# Patient Record
Sex: Female | Born: 1960
Health system: Southern US, Community
[De-identification: ages and names within clinical notes are randomized; demographics above are authoritative.]

## PROBLEM LIST (undated history)

## (undated) HISTORY — PX: LAPAROSCOPY: SHX197

## (undated) HISTORY — PX: BREAST BIOPSY: SHX20

---

## 1998-02-12 ENCOUNTER — Other Ambulatory Visit: Admission: RE | Admit: 1998-02-12 | Discharge: 1998-02-12 | Payer: Self-pay | Admitting: Obstetrics and Gynecology

## 1998-11-05 ENCOUNTER — Ambulatory Visit (HOSPITAL_COMMUNITY): Admission: RE | Admit: 1998-11-05 | Discharge: 1998-11-05 | Payer: Self-pay | Admitting: *Deleted

## 1999-04-12 ENCOUNTER — Other Ambulatory Visit: Admission: RE | Admit: 1999-04-12 | Discharge: 1999-04-12 | Payer: Self-pay | Admitting: Obstetrics and Gynecology

## 1999-05-02 ENCOUNTER — Ambulatory Visit (HOSPITAL_COMMUNITY): Admission: RE | Admit: 1999-05-02 | Discharge: 1999-05-02 | Payer: Self-pay | Admitting: Obstetrics and Gynecology

## 1999-05-02 ENCOUNTER — Encounter: Payer: Self-pay | Admitting: Obstetrics and Gynecology

## 1999-10-28 ENCOUNTER — Encounter: Admission: RE | Admit: 1999-10-28 | Discharge: 1999-10-28 | Payer: Self-pay | Admitting: Obstetrics and Gynecology

## 1999-10-28 ENCOUNTER — Encounter: Payer: Self-pay | Admitting: Obstetrics and Gynecology

## 2000-03-14 ENCOUNTER — Encounter (INDEPENDENT_AMBULATORY_CARE_PROVIDER_SITE_OTHER): Payer: Self-pay | Admitting: Specialist

## 2000-03-14 ENCOUNTER — Ambulatory Visit (HOSPITAL_COMMUNITY): Admission: RE | Admit: 2000-03-14 | Discharge: 2000-03-14 | Payer: Self-pay | Admitting: Gastroenterology

## 2000-05-07 ENCOUNTER — Other Ambulatory Visit: Admission: RE | Admit: 2000-05-07 | Discharge: 2000-05-07 | Payer: Self-pay | Admitting: Obstetrics and Gynecology

## 2000-10-31 ENCOUNTER — Encounter: Admission: RE | Admit: 2000-10-31 | Discharge: 2000-10-31 | Payer: Self-pay | Admitting: Obstetrics and Gynecology

## 2000-10-31 ENCOUNTER — Encounter: Payer: Self-pay | Admitting: Obstetrics and Gynecology

## 2001-04-30 ENCOUNTER — Encounter: Admission: RE | Admit: 2001-04-30 | Discharge: 2001-04-30 | Payer: Self-pay | Admitting: Obstetrics and Gynecology

## 2001-04-30 ENCOUNTER — Encounter: Payer: Self-pay | Admitting: Obstetrics and Gynecology

## 2001-06-06 ENCOUNTER — Other Ambulatory Visit: Admission: RE | Admit: 2001-06-06 | Discharge: 2001-06-06 | Payer: Self-pay | Admitting: Obstetrics and Gynecology

## 2001-11-05 ENCOUNTER — Encounter: Payer: Self-pay | Admitting: Obstetrics and Gynecology

## 2001-11-05 ENCOUNTER — Encounter: Admission: RE | Admit: 2001-11-05 | Discharge: 2001-11-05 | Payer: Self-pay | Admitting: Obstetrics and Gynecology

## 2002-04-21 ENCOUNTER — Encounter: Payer: Self-pay | Admitting: Obstetrics and Gynecology

## 2002-04-21 ENCOUNTER — Encounter: Admission: RE | Admit: 2002-04-21 | Discharge: 2002-04-21 | Payer: Self-pay | Admitting: Obstetrics and Gynecology

## 2002-06-24 ENCOUNTER — Other Ambulatory Visit: Admission: RE | Admit: 2002-06-24 | Discharge: 2002-06-24 | Payer: Self-pay | Admitting: Obstetrics and Gynecology

## 2002-11-19 ENCOUNTER — Encounter: Admission: RE | Admit: 2002-11-19 | Discharge: 2002-11-19 | Payer: Self-pay | Admitting: Obstetrics and Gynecology

## 2002-11-19 ENCOUNTER — Encounter: Payer: Self-pay | Admitting: Obstetrics and Gynecology

## 2003-04-10 ENCOUNTER — Ambulatory Visit (HOSPITAL_COMMUNITY): Admission: RE | Admit: 2003-04-10 | Discharge: 2003-04-10 | Payer: Self-pay | Admitting: Obstetrics and Gynecology

## 2003-07-07 ENCOUNTER — Other Ambulatory Visit: Admission: RE | Admit: 2003-07-07 | Discharge: 2003-07-07 | Payer: Self-pay | Admitting: Obstetrics and Gynecology

## 2003-11-20 ENCOUNTER — Encounter: Admission: RE | Admit: 2003-11-20 | Discharge: 2003-11-20 | Payer: Self-pay | Admitting: Obstetrics and Gynecology

## 2004-05-02 ENCOUNTER — Encounter: Admission: RE | Admit: 2004-05-02 | Discharge: 2004-05-02 | Payer: Self-pay | Admitting: Obstetrics and Gynecology

## 2004-07-13 ENCOUNTER — Other Ambulatory Visit: Admission: RE | Admit: 2004-07-13 | Discharge: 2004-07-13 | Payer: Self-pay | Admitting: Obstetrics and Gynecology

## 2005-03-09 ENCOUNTER — Ambulatory Visit (HOSPITAL_COMMUNITY): Admission: RE | Admit: 2005-03-09 | Discharge: 2005-03-09 | Payer: Self-pay | Admitting: Obstetrics and Gynecology

## 2005-04-05 ENCOUNTER — Encounter: Admission: RE | Admit: 2005-04-05 | Discharge: 2005-04-05 | Payer: Self-pay | Admitting: Obstetrics and Gynecology

## 2005-09-29 ENCOUNTER — Ambulatory Visit (HOSPITAL_COMMUNITY): Admission: RE | Admit: 2005-09-29 | Discharge: 2005-09-29 | Payer: Self-pay | Admitting: Internal Medicine

## 2005-10-06 ENCOUNTER — Encounter: Admission: RE | Admit: 2005-10-06 | Discharge: 2005-10-06 | Payer: Self-pay | Admitting: Obstetrics and Gynecology

## 2006-03-16 ENCOUNTER — Encounter: Admission: RE | Admit: 2006-03-16 | Discharge: 2006-03-16 | Payer: Self-pay | Admitting: Obstetrics and Gynecology

## 2007-03-22 ENCOUNTER — Encounter: Admission: RE | Admit: 2007-03-22 | Discharge: 2007-03-22 | Payer: Self-pay | Admitting: Obstetrics and Gynecology

## 2007-09-20 ENCOUNTER — Encounter: Admission: RE | Admit: 2007-09-20 | Discharge: 2007-09-20 | Payer: Self-pay | Admitting: Obstetrics and Gynecology

## 2008-06-16 ENCOUNTER — Ambulatory Visit (HOSPITAL_COMMUNITY): Admission: RE | Admit: 2008-06-16 | Discharge: 2008-06-16 | Payer: Self-pay | Admitting: Internal Medicine

## 2008-06-17 ENCOUNTER — Emergency Department (HOSPITAL_COMMUNITY): Admission: EM | Admit: 2008-06-17 | Discharge: 2008-06-17 | Payer: Self-pay | Admitting: Emergency Medicine

## 2008-12-11 ENCOUNTER — Encounter: Admission: RE | Admit: 2008-12-11 | Discharge: 2008-12-11 | Payer: Self-pay | Admitting: Obstetrics and Gynecology

## 2009-02-02 ENCOUNTER — Encounter: Admission: RE | Admit: 2009-02-02 | Discharge: 2009-02-02 | Payer: Self-pay | Admitting: Obstetrics and Gynecology

## 2009-07-15 ENCOUNTER — Encounter (INDEPENDENT_AMBULATORY_CARE_PROVIDER_SITE_OTHER): Payer: Self-pay | Admitting: Cardiology

## 2009-07-15 ENCOUNTER — Ambulatory Visit (HOSPITAL_COMMUNITY): Admission: RE | Admit: 2009-07-15 | Discharge: 2009-07-15 | Payer: Self-pay | Admitting: Cardiology

## 2010-02-04 ENCOUNTER — Encounter: Admission: RE | Admit: 2010-02-04 | Discharge: 2010-02-04 | Payer: Self-pay | Admitting: Obstetrics and Gynecology

## 2010-04-23 ENCOUNTER — Encounter: Payer: Self-pay | Admitting: Obstetrics and Gynecology

## 2010-04-24 ENCOUNTER — Encounter: Payer: Self-pay | Admitting: Obstetrics and Gynecology

## 2010-07-14 LAB — BASIC METABOLIC PANEL
CO2: 30 mEq/L (ref 19–32)
Chloride: 104 mEq/L (ref 96–112)
Creatinine, Ser: 0.87 mg/dL (ref 0.4–1.2)
GFR calc Af Amer: >60 mL/min (ref >60)
Glucose, Bld: 131 mg/dL — ABNORMAL HIGH (ref 70–99)
Sodium: 139 mEq/L (ref 135–145)

## 2010-07-14 LAB — CBC
Hemoglobin: 12.6 g/dL (ref 12.0–15.0)
MCHC: 34.3 g/dL (ref 30.0–36.0)
MCV: 92.1 fL (ref 78.0–100.0)
RBC: 3.98 MIL/uL (ref 3.87–5.11)
RDW: 12.9 % (ref 11.5–15.5)

## 2010-07-14 LAB — DIFFERENTIAL
Basophils Relative: 1 % (ref 0–1)
Eosinophils Absolute: 0.3 10*3/uL (ref 0.0–0.7)
Monocytes Absolute: 0.5 10*3/uL (ref 0.1–1.0)
Monocytes Relative: 11 % (ref 3–12)

## 2010-08-19 NOTE — Procedures (Signed)
[place]  Patient:    Brandi Davis, Brandi Davis                     MRN: [id number] Proc. Date: 03/14/00 Adm. Date:  [id number] Attending:  Charna Elizabeth CC:         Dr. Rita Ohara   Procedure Report  DATE OF BIRTH:  October 15, 1960  PROCEDURE PERFORMED:  Esophagogastroduodenoscopy.  ENDOSCOPIST:  Anselmo Rod, M.D.  INSTRUMENT USED:  Olympus video panendoscope.  INDICATIONS:  Epigastric pain and weight loss in a 49 year old white female. Rule out peptic ulcer disease, esophagitis, gastritis, etc.  PREPROCEDURE PREPARATION:  Informed consent was procured from the patient. The patient was fasted for 8 hours prior to the procedure.  PREPROCEDURE PHYSICAL:  Patient has stable vital signs.  NECK:  Supple.  CHEST:  Clear to auscultation. S1, S2 regular.  ABDOMEN:  Soft with normal abdominal bowel sounds.  DESCRIPTION OF PROCEDURE:  The patient was placed in the left lateral decubitus position and sedated with 3 mg of Versed intravenously.  Once the patient was adequately sedated and maintained on low-flow oxygen and continuous cardiac monitoring, the Olympus video panendoscope was advanced through the mouth piece, over the tongue into the esophagus under direct vision.  The entire esophagus appeared normal without evidence of rings, stricture, masses, lesions, esophagitis or Barretts mucosa.  The scope was then advanced into the stomach.  Except for a small hiatal hernia, no other abnormalities were seen.  The proximal small bowel also appeared normal, except for mild edema of the tissue. Random small bowel biopsies were done to rule out celiac sprue, etc.  The patient tolerated the procedure well without complications.  IMPRESSION:  Essentially normal esophagogastroduodenoscopy except for mild edematous mucosa in the small bowel and a small hiatal hernia.  Small bowel biopsies done, results pending.  RECOMMENDATIONS: 1. Await pathology  results. 2. Outpatient follow-up in the next two to three weeks. DD:  03/14/00 TD:  03/14/00 Job: [id number]

## 2010-11-25 ENCOUNTER — Ambulatory Visit (INDEPENDENT_AMBULATORY_CARE_PROVIDER_SITE_OTHER): Payer: 59 | Admitting: Urology

## 2010-11-25 DIAGNOSIS — R3989 Other symptoms and signs involving the genitourinary system: Secondary | ICD-10-CM

## 2010-11-25 DIAGNOSIS — N949 Unspecified condition associated with female genital organs and menstrual cycle: Secondary | ICD-10-CM

## 2010-11-25 DIAGNOSIS — R350 Frequency of micturition: Secondary | ICD-10-CM

## 2011-02-27 ENCOUNTER — Other Ambulatory Visit: Payer: Self-pay | Admitting: Obstetrics and Gynecology

## 2011-02-27 DIAGNOSIS — Z1231 Encounter for screening mammogram for malignant neoplasm of breast: Secondary | ICD-10-CM

## 2011-03-17 ENCOUNTER — Ambulatory Visit
Admission: RE | Admit: 2011-03-17 | Discharge: 2011-03-17 | Disposition: A | Discharge status: Home / Self Care | Payer: 59 | Source: Ambulatory Visit | Attending: Obstetrics and Gynecology | Admitting: Obstetrics and Gynecology

## 2011-03-17 DIAGNOSIS — Z1231 Encounter for screening mammogram for malignant neoplasm of breast: Secondary | ICD-10-CM

## 2011-03-24 ENCOUNTER — Ambulatory Visit (INDEPENDENT_AMBULATORY_CARE_PROVIDER_SITE_OTHER): Payer: 59 | Admitting: Urology

## 2011-03-24 DIAGNOSIS — R350 Frequency of micturition: Secondary | ICD-10-CM

## 2011-03-24 DIAGNOSIS — N949 Unspecified condition associated with female genital organs and menstrual cycle: Secondary | ICD-10-CM

## 2011-03-24 DIAGNOSIS — IMO0002 Reserved for concepts with insufficient information to code with codable children: Secondary | ICD-10-CM

## 2012-08-16 ENCOUNTER — Other Ambulatory Visit: Payer: Self-pay

## 2012-08-16 DIAGNOSIS — Z1231 Encounter for screening mammogram for malignant neoplasm of breast: Secondary | ICD-10-CM

## 2012-09-06 ENCOUNTER — Ambulatory Visit: Payer: 59

## 2012-10-25 ENCOUNTER — Ambulatory Visit: Payer: 59

## 2012-11-08 ENCOUNTER — Ambulatory Visit
Admission: RE | Admit: 2012-11-08 | Discharge: 2012-11-08 | Disposition: A | Discharge status: Home / Self Care | Payer: 59 | Source: Ambulatory Visit

## 2012-11-08 DIAGNOSIS — Z1231 Encounter for screening mammogram for malignant neoplasm of breast: Secondary | ICD-10-CM

## 2013-01-01 ENCOUNTER — Encounter (INDEPENDENT_AMBULATORY_CARE_PROVIDER_SITE_OTHER): Payer: Self-pay | Admitting: Surgery

## 2013-01-01 ENCOUNTER — Ambulatory Visit (INDEPENDENT_AMBULATORY_CARE_PROVIDER_SITE_OTHER): Payer: 59 | Admitting: Surgery

## 2013-01-01 VITALS — BP 118/70 | HR 68 | Resp 16 | Ht 64.0 in | Wt 119.6 lb

## 2013-01-01 DIAGNOSIS — M799 Soft tissue disorder, unspecified: Secondary | ICD-10-CM

## 2013-01-01 DIAGNOSIS — M7989 Other specified soft tissue disorders: Secondary | ICD-10-CM

## 2013-01-01 NOTE — Progress Notes (Signed)
General Surgery [facility]  Chief Complaint  Patient presents with  . New Evaluation    evaluate nodule above left clavicle - referral from Dr. Richardean Chimera    HISTORY: Patient is a 51 year old female referred by her gynecologist for evaluation of a persistent soft tissue mass at the left anterior shoulder. Patient had an episode of an allergic reaction on the skin of the left shoulder following some type of an insect bite in July 2014. At that time she noted this soft tissue mass on the anterior left shoulder. She was treated with 2 courses of oral steroids without improvement in the mass. The allergic reaction did resolve. She was examined by her dermatologist and her gynecologist. She was suspected to have an enlarged lymph node related to the inflammatory process and was treated with oral antibiotics. Again there was no significant change in the mass. Patient feels that the mass is gradually increased in size. It causes her minor discomfort with physical activity and occasionally "aches". She presents today for further evaluation. She has had no diagnostic studies.  No prior surgery of the head neck or upper chest.  History reviewed. No pertinent past medical history.  Current Outpatient Prescriptions  Medication Sig Dispense Refill  . CALCIUM PO Take by mouth.      . cycloSPORINE (RESTASIS) 0.05 % ophthalmic emulsion 1 drop 2 (two) times daily.      . fish oil-omega-3 fatty acids 1000 MG capsule Take 2 g by mouth daily.      . Multiple Vitamin (MULTIVITAMIN) tablet Take 1 tablet by mouth daily.      . pantoprazole (PROTONIX) 40 MG tablet Take 40 mg by mouth daily.       No current facility-administered medications for this visit.    Allergies  Allergen Reactions  . Penicillins Shortness Of Breath and Diarrhea    Family History  Problem Relation Age of Onset  . Other Father     bladder cancer    History   Social History  . Marital Status: Married   Spouse Name: N/A    Number of Children: N/A  . Years of Education: N/A   Social History Main Topics  . Smoking status: Never Smoker   . Smokeless tobacco: Never Used  . Alcohol Use: No  . Drug Use: No  . Sexual Activity: None   Other Topics Concern  . None   Social History Narrative  . None    REVIEW OF SYSTEMS - PERTINENT POSITIVES ONLY: Denies fevers or chills. Denies drainage. Denies history of trauma.  EXAM: Filed Vitals:   01/01/13 0858  BP: 118/70  Pulse: 68  Resp: 16    HEENT: normocephalic; pupils equal and reactive; sclerae clear; dentition good; mucous membranes moist NECK:  symmetric on extension; no palpable anterior or posterior cervical lymphadenopathy; no supraclavicular masses; no tenderness CHEST: clear to auscultation bilaterally without rales, rhonchi, or wheezes CARDIAC: regular rate and rhythm without significant murmur; peripheral pulses are full EXT:  non-tender without edema; no deformity; palpable firm density approximately 2 cm in size in the left deltopectoral groove below the level of the clavicle, mildly tender; no supraclavicular lymphadenopathy; no evidence of bilateral axillary lymphadenopathy NEURO: no gross focal deficits; no sign of tremor   LABORATORY RESULTS: See [facility] (CHL-Epic) for most recent results  RADIOLOGY RESULTS: See [facility] (CHL-Epic) for most recent results  IMPRESSION: Palpable 2 cm nodular density left deltopectoral groove of uncertain significance  PLAN: The patient  and I discussed the above findings. We reviewed office notes from her gynecologist. I would like to obtain a CT scan of the chest with contrast with attention to the area of the left deltopectoral groove. We will arrange for this study. I will contact her with the results.  If this represents an abnormal lymph node or soft tissue tumor, it may be amenable to resection as an outpatient procedure. If this appears to be more involved with  the left shoulder joint, then I will likely make a referral to orthopedics surgery for further assessment.  I will contact the patient with the results of her CT scan.  Velora Heckler, MD, FACS General & Endocrine Surgery [facility]  Primary Care Physician: Catalina Pizza, MD

## 2013-01-02 ENCOUNTER — Ambulatory Visit
Admission: RE | Admit: 2013-01-02 | Discharge: 2013-01-02 | Disposition: A | Discharge status: Home / Self Care | Payer: 59 | Source: Ambulatory Visit | Attending: Surgery | Admitting: Surgery

## 2013-01-02 DIAGNOSIS — M7989 Other specified soft tissue disorders: Secondary | ICD-10-CM

## 2013-01-02 MED ORDER — IOHEXOL 300 MG/ML  SOLN: 75.0000 mL | Freq: Once | INTRAMUSCULAR | Status: AC | PRN

## 2013-01-07 ENCOUNTER — Telehealth (INDEPENDENT_AMBULATORY_CARE_PROVIDER_SITE_OTHER): Payer: Self-pay | Admitting: Surgery

## 2013-01-07 ENCOUNTER — Other Ambulatory Visit (INDEPENDENT_AMBULATORY_CARE_PROVIDER_SITE_OTHER): Payer: Self-pay

## 2013-01-07 ENCOUNTER — Telehealth (INDEPENDENT_AMBULATORY_CARE_PROVIDER_SITE_OTHER): Payer: Self-pay | Admitting: *Deleted

## 2013-01-07 DIAGNOSIS — R2232 Localized swelling, mass and lump, left upper limb: Secondary | ICD-10-CM

## 2013-01-07 NOTE — Telephone Encounter (Signed)
Referral request for referral to  Dr Rennis Chris to referral coord. To set up appt and call pt.

## 2013-01-07 NOTE — Telephone Encounter (Signed)
Spoke with pt to inform her of her appt with Dr. Rennis Chris at [facility] on 01/22/13 at 2:45.

## 2013-01-07 NOTE — Telephone Encounter (Signed)
CT chest does not show any mass, lymph node, tumor, or other abnormality at site of palpable nodule left shoulder.  Discussed with patient today.  Will ask for referral to Dr. Francena Hanly at [facility] for evaluation of left shoulder.  Patient agrees with this plan.  Velora Heckler, MD, [facility] Office: [phone]  Arline Asp - please arrange consult with Dr. Rennis Chris.  tmg

## 2013-10-20 ENCOUNTER — Other Ambulatory Visit: Payer: Self-pay

## 2013-10-20 DIAGNOSIS — Z1231 Encounter for screening mammogram for malignant neoplasm of breast: Secondary | ICD-10-CM

## 2013-11-14 ENCOUNTER — Ambulatory Visit
Admission: RE | Admit: 2013-11-14 | Discharge: 2013-11-14 | Disposition: A | Discharge status: Home / Self Care | Payer: 59 | Source: Ambulatory Visit

## 2013-11-14 DIAGNOSIS — Z1231 Encounter for screening mammogram for malignant neoplasm of breast: Secondary | ICD-10-CM

## 2014-04-02 ENCOUNTER — Other Ambulatory Visit: Payer: Self-pay | Admitting: Dermatology

## 2014-11-03 ENCOUNTER — Other Ambulatory Visit: Payer: Self-pay

## 2014-11-03 DIAGNOSIS — Z1231 Encounter for screening mammogram for malignant neoplasm of breast: Secondary | ICD-10-CM

## 2014-11-17 ENCOUNTER — Ambulatory Visit: Payer: Self-pay

## 2014-11-27 ENCOUNTER — Ambulatory Visit
Admission: RE | Admit: 2014-11-27 | Discharge: 2014-11-27 | Disposition: A | Discharge status: Home / Self Care | Payer: 59 | Source: Ambulatory Visit

## 2014-11-27 DIAGNOSIS — Z1231 Encounter for screening mammogram for malignant neoplasm of breast: Secondary | ICD-10-CM

## 2014-11-30 ENCOUNTER — Other Ambulatory Visit: Payer: Self-pay | Admitting: Obstetrics and Gynecology

## 2014-12-01 LAB — CYTOLOGY - PAP

## 2015-10-22 ENCOUNTER — Other Ambulatory Visit: Payer: Self-pay | Admitting: Obstetrics and Gynecology

## 2015-10-22 DIAGNOSIS — Z1231 Encounter for screening mammogram for malignant neoplasm of breast: Secondary | ICD-10-CM

## 2015-11-29 ENCOUNTER — Ambulatory Visit
Admission: RE | Admit: 2015-11-29 | Discharge: 2015-11-29 | Disposition: A | Discharge status: Home / Self Care | Payer: 59 | Source: Ambulatory Visit | Attending: Obstetrics and Gynecology | Admitting: Obstetrics and Gynecology

## 2015-11-29 DIAGNOSIS — Z1231 Encounter for screening mammogram for malignant neoplasm of breast: Secondary | ICD-10-CM

## 2015-11-30 ENCOUNTER — Ambulatory Visit: Payer: 59

## 2016-04-17 DIAGNOSIS — H1033 Unspecified acute conjunctivitis, bilateral: Secondary | ICD-10-CM | POA: Diagnosis not present

## 2016-04-17 DIAGNOSIS — J060 Acute laryngopharyngitis: Secondary | ICD-10-CM | POA: Diagnosis not present

## 2016-04-21 DIAGNOSIS — H6502 Acute serous otitis media, left ear: Secondary | ICD-10-CM | POA: Diagnosis not present

## 2016-06-23 DIAGNOSIS — H2513 Age-related nuclear cataract, bilateral: Secondary | ICD-10-CM | POA: Diagnosis not present

## 2016-06-23 DIAGNOSIS — H16223 Keratoconjunctivitis sicca, not specified as Sjogren's, bilateral: Secondary | ICD-10-CM | POA: Diagnosis not present

## 2016-06-23 DIAGNOSIS — H524 Presbyopia: Secondary | ICD-10-CM | POA: Diagnosis not present

## 2016-07-21 DIAGNOSIS — D1801 Hemangioma of skin and subcutaneous tissue: Secondary | ICD-10-CM | POA: Diagnosis not present

## 2016-07-21 DIAGNOSIS — D2262 Melanocytic nevi of left upper limb, including shoulder: Secondary | ICD-10-CM | POA: Diagnosis not present

## 2016-07-21 DIAGNOSIS — B351 Tinea unguium: Secondary | ICD-10-CM | POA: Diagnosis not present

## 2016-07-21 DIAGNOSIS — D2261 Melanocytic nevi of right upper limb, including shoulder: Secondary | ICD-10-CM | POA: Diagnosis not present

## 2016-11-16 ENCOUNTER — Other Ambulatory Visit: Payer: Self-pay | Admitting: Obstetrics and Gynecology

## 2016-11-16 DIAGNOSIS — Z1231 Encounter for screening mammogram for malignant neoplasm of breast: Secondary | ICD-10-CM

## 2016-11-20 DIAGNOSIS — N6459 Other signs and symptoms in breast: Secondary | ICD-10-CM | POA: Diagnosis not present

## 2016-11-22 ENCOUNTER — Other Ambulatory Visit: Payer: Self-pay | Admitting: Obstetrics and Gynecology

## 2016-11-22 DIAGNOSIS — Z1231 Encounter for screening mammogram for malignant neoplasm of breast: Secondary | ICD-10-CM

## 2016-12-01 ENCOUNTER — Ambulatory Visit
Admission: RE | Admit: 2016-12-01 | Discharge: 2016-12-01 | Disposition: A | Discharge status: Home / Self Care | Payer: 59 | Source: Ambulatory Visit | Attending: Obstetrics and Gynecology | Admitting: Obstetrics and Gynecology

## 2016-12-01 DIAGNOSIS — Z1231 Encounter for screening mammogram for malignant neoplasm of breast: Secondary | ICD-10-CM

## 2016-12-05 DIAGNOSIS — Z6820 Body mass index (BMI) 20.0-20.9, adult: Secondary | ICD-10-CM | POA: Diagnosis not present

## 2016-12-05 DIAGNOSIS — Z01419 Encounter for gynecological examination (general) (routine) without abnormal findings: Secondary | ICD-10-CM | POA: Diagnosis not present

## 2016-12-12 DIAGNOSIS — K219 Gastro-esophageal reflux disease without esophagitis: Secondary | ICD-10-CM | POA: Diagnosis not present

## 2016-12-29 DIAGNOSIS — H16223 Keratoconjunctivitis sicca, not specified as Sjogren's, bilateral: Secondary | ICD-10-CM | POA: Diagnosis not present

## 2016-12-29 DIAGNOSIS — H524 Presbyopia: Secondary | ICD-10-CM | POA: Diagnosis not present

## 2016-12-29 DIAGNOSIS — H5319 Other subjective visual disturbances: Secondary | ICD-10-CM | POA: Diagnosis not present

## 2017-01-19 DIAGNOSIS — H524 Presbyopia: Secondary | ICD-10-CM | POA: Diagnosis not present

## 2017-01-30 DIAGNOSIS — N390 Urinary tract infection, site not specified: Secondary | ICD-10-CM | POA: Diagnosis not present

## 2017-02-14 DIAGNOSIS — L900 Lichen sclerosus et atrophicus: Secondary | ICD-10-CM | POA: Diagnosis not present

## 2017-03-06 DIAGNOSIS — N94819 Vulvodynia, unspecified: Secondary | ICD-10-CM | POA: Diagnosis not present

## 2017-03-06 DIAGNOSIS — N762 Acute vulvitis: Secondary | ICD-10-CM | POA: Diagnosis not present

## 2017-08-28 DIAGNOSIS — H524 Presbyopia: Secondary | ICD-10-CM | POA: Diagnosis not present

## 2017-08-28 DIAGNOSIS — H2513 Age-related nuclear cataract, bilateral: Secondary | ICD-10-CM | POA: Diagnosis not present

## 2017-08-28 DIAGNOSIS — H16223 Keratoconjunctivitis sicca, not specified as Sjogren's, bilateral: Secondary | ICD-10-CM | POA: Diagnosis not present

## 2017-09-07 DIAGNOSIS — D225 Melanocytic nevi of trunk: Secondary | ICD-10-CM | POA: Diagnosis not present

## 2017-09-07 DIAGNOSIS — D1801 Hemangioma of skin and subcutaneous tissue: Secondary | ICD-10-CM | POA: Diagnosis not present

## 2017-09-07 DIAGNOSIS — D2261 Melanocytic nevi of right upper limb, including shoulder: Secondary | ICD-10-CM | POA: Diagnosis not present

## 2017-11-14 DIAGNOSIS — M722 Plantar fascial fibromatosis: Secondary | ICD-10-CM | POA: Diagnosis not present

## 2017-11-14 DIAGNOSIS — M21612 Bunion of left foot: Secondary | ICD-10-CM | POA: Diagnosis not present

## 2017-11-14 DIAGNOSIS — M21611 Bunion of right foot: Secondary | ICD-10-CM | POA: Diagnosis not present

## 2017-12-06 DIAGNOSIS — Z6820 Body mass index (BMI) 20.0-20.9, adult: Secondary | ICD-10-CM | POA: Diagnosis not present

## 2017-12-06 DIAGNOSIS — Z01419 Encounter for gynecological examination (general) (routine) without abnormal findings: Secondary | ICD-10-CM | POA: Diagnosis not present

## 2017-12-10 ENCOUNTER — Other Ambulatory Visit: Payer: Self-pay | Admitting: Obstetrics and Gynecology

## 2017-12-10 DIAGNOSIS — R928 Other abnormal and inconclusive findings on diagnostic imaging of breast: Secondary | ICD-10-CM

## 2017-12-13 ENCOUNTER — Ambulatory Visit: Payer: 59

## 2017-12-13 ENCOUNTER — Ambulatory Visit
Admission: RE | Admit: 2017-12-13 | Discharge: 2017-12-13 | Disposition: A | Discharge status: Home / Self Care | Payer: 59 | Source: Ambulatory Visit | Attending: Obstetrics and Gynecology | Admitting: Obstetrics and Gynecology

## 2017-12-13 DIAGNOSIS — R922 Inconclusive mammogram: Secondary | ICD-10-CM | POA: Diagnosis not present

## 2017-12-13 DIAGNOSIS — R928 Other abnormal and inconclusive findings on diagnostic imaging of breast: Secondary | ICD-10-CM

## 2017-12-17 DIAGNOSIS — Z1329 Encounter for screening for other suspected endocrine disorder: Secondary | ICD-10-CM | POA: Diagnosis not present

## 2017-12-17 DIAGNOSIS — Z13228 Encounter for screening for other metabolic disorders: Secondary | ICD-10-CM | POA: Diagnosis not present

## 2017-12-17 DIAGNOSIS — Z13220 Encounter for screening for lipoid disorders: Secondary | ICD-10-CM | POA: Diagnosis not present

## 2017-12-27 DIAGNOSIS — R1013 Epigastric pain: Secondary | ICD-10-CM | POA: Diagnosis not present

## 2017-12-27 DIAGNOSIS — K219 Gastro-esophageal reflux disease without esophagitis: Secondary | ICD-10-CM | POA: Diagnosis not present

## 2017-12-27 DIAGNOSIS — M810 Age-related osteoporosis without current pathological fracture: Secondary | ICD-10-CM | POA: Diagnosis not present

## 2018-01-03 DIAGNOSIS — M21612 Bunion of left foot: Secondary | ICD-10-CM | POA: Diagnosis not present

## 2018-01-03 DIAGNOSIS — M21611 Bunion of right foot: Secondary | ICD-10-CM | POA: Diagnosis not present

## 2018-02-04 DIAGNOSIS — E559 Vitamin D deficiency, unspecified: Secondary | ICD-10-CM | POA: Diagnosis not present

## 2018-02-14 DIAGNOSIS — Z0000 Encounter for general adult medical examination without abnormal findings: Secondary | ICD-10-CM | POA: Diagnosis not present

## 2018-05-08 DIAGNOSIS — L821 Other seborrheic keratosis: Secondary | ICD-10-CM | POA: Diagnosis not present

## 2018-05-08 DIAGNOSIS — L720 Epidermal cyst: Secondary | ICD-10-CM | POA: Diagnosis not present

## 2018-05-08 DIAGNOSIS — D2239 Melanocytic nevi of other parts of face: Secondary | ICD-10-CM | POA: Diagnosis not present

## 2019-02-04 ENCOUNTER — Other Ambulatory Visit: Payer: Self-pay

## 2019-02-04 ENCOUNTER — Ambulatory Visit (HOSPITAL_COMMUNITY)
Admission: RE | Admit: 2019-02-04 | Discharge: 2019-02-04 | Disposition: A | Discharge status: Home / Self Care | Payer: 59 | Source: Ambulatory Visit | Attending: Internal Medicine | Admitting: Internal Medicine

## 2019-02-04 ENCOUNTER — Other Ambulatory Visit (HOSPITAL_COMMUNITY): Payer: Self-pay | Admitting: Internal Medicine

## 2019-02-04 DIAGNOSIS — R52 Pain, unspecified: Secondary | ICD-10-CM | POA: Diagnosis not present

## 2019-07-15 ENCOUNTER — Other Ambulatory Visit: Payer: Self-pay | Admitting: Podiatry

## 2019-07-15 ENCOUNTER — Ambulatory Visit (INDEPENDENT_AMBULATORY_CARE_PROVIDER_SITE_OTHER): Payer: 59 | Admitting: Podiatry

## 2019-07-15 ENCOUNTER — Encounter: Payer: Self-pay | Admitting: Podiatry

## 2019-07-15 ENCOUNTER — Other Ambulatory Visit: Payer: Self-pay

## 2019-07-15 ENCOUNTER — Ambulatory Visit (INDEPENDENT_AMBULATORY_CARE_PROVIDER_SITE_OTHER): Payer: 59

## 2019-07-15 VITALS — Temp 97.7°F

## 2019-07-15 DIAGNOSIS — S92505D Nondisplaced unspecified fracture of left lesser toe(s), subsequent encounter for fracture with routine healing: Secondary | ICD-10-CM | POA: Diagnosis not present

## 2019-07-15 DIAGNOSIS — M778 Other enthesopathies, not elsewhere classified: Secondary | ICD-10-CM | POA: Diagnosis not present

## 2019-07-15 DIAGNOSIS — M779 Enthesopathy, unspecified: Secondary | ICD-10-CM

## 2019-07-15 NOTE — Progress Notes (Signed)
Subjective:   Patient ID: Brandi Davis, female   DOB: 59 y.o.   MRN: [id number]   HPI 59 year old female presents the office for concerns of pain to her left fifth toe.  She states that she broke the toe November 2.  She states that she hit it on a piece of furniture.  She has seen her primary care physician for this as well as any pain in urgent care.  She states that she was doing okay then when she started get back into her normal walking she is getting pain and swelling in the toe will turn red at times.  Due to this she has stopped her normal walking.   Review of Systems  All other systems reviewed and are negative.  History reviewed. No pertinent past medical history.  Past Surgical History:  Procedure Laterality Date  . BREAST BIOPSY    . LAPAROSCOPY     x3     Current Outpatient Medications:  .  CALCIUM PO, Take by mouth., Disp: , Rfl:  .  cycloSPORINE (RESTASIS) 0.05 % ophthalmic emulsion, 1 drop 2 (two) times daily., Disp: , Rfl:  .  fish oil-omega-3 fatty acids 1000 MG capsule, Take 2 g by mouth daily., Disp: , Rfl:  .  Multiple Vitamin (MULTIVITAMIN) tablet, Take 1 tablet by mouth daily., Disp: , Rfl:  .  pantoprazole (PROTONIX) 40 MG tablet, Take 40 mg by mouth daily., Disp: , Rfl:   Allergies  Allergen Reactions  . Penicillins Shortness Of Breath and Diarrhea  . Bacitracin-Polymyxin B   . Latex   . Neosporin + Pain Relief Max St  [Neomy-Bacit-Polymyx-Pramoxine]          Objective:  Physical Exam  General: AAO x3, NAD  Dermatological: Skin is warm, dry and supple bilateral. Nails x 10 are well manicured; remaining integument appears unremarkable at this time. There are no open sores, no preulcerative lesions, no rash or signs of infection present.  Vascular: Dorsalis Pedis artery and Posterior Tibial artery pedal pulses are 2/4 bilateral with immedate capillary fill time. Pedal hair growth present. No varicosities and no lower extremity edema present  bilateral. There is no pain with calf compression, swelling, warmth, erythema.   Neruologic: Grossly intact via light touch bilateral.   Musculoskeletal: There is minimal tenderness palpation of the fifth digit.  The toes appear to be in rectus position there is mild swelling however.  Mild erythema point rubs inside shoes.  There is no warmth or skin breakdown.  There is decreased range of motion of the PIPJ.  Gait: Unassisted, Nonantalgic.       Assessment:   59 year old female with fracture left fifth toe    Plan:  -Treatment options discussed including all alternatives, risks, and complications -Etiology of symptoms were discussed -X-rays were obtained and reviewed with the patient.  There is a step-off present the PIPJ still able to see the fracture line faintly. -At this point we discussed with conservative as well as surgical options.  For now and continue conservative care.  Dispensed offloading pads.  Discussed with her gradually increased her normal walking activities and shoes that avoid pressure.  If she continues to have symptoms long-term consider PIPJ arthroplasty treatment of the fracture fragment and help with ROM  Return for 6-8 weeks for toe fracture.  Trula Slade DPM

## 2019-07-15 NOTE — Patient Instructions (Signed)
Toe Fracture A toe fracture is a break in one of the toe bones (phalanges). This may happen if you:  Drop a heavy object on your toe.  Stub your toe.  Twist your toe.  Exercise the same way too much. What are the signs or symptoms? The main symptoms are swelling and pain in the toe. You may also have:  Bruising.  Stiffness.  Numbness.  A change in the way the toe looks.  Broken bones that poke through the skin.  Blood under the toenail. How is this treated? Treatments may include:  Taping the broken toe to a toe that is next to it (buddy taping).  Wearing a shoe that has a wide, rigid sole to protect the toe and to limit its movement.  Wearing a cast.  Surgery. This may be needed if the: ? Pieces of broken bone are out of place. ? Bone pokes through the skin.  Physical therapy. Follow these instructions at home: If you have a shoe:  Wear the shoe as told by your doctor. Remove it only as told by your doctor.  Loosen the shoe if your toes tingle, become numb, or turn cold and blue.  Keep the shoe clean and dry. If you have a cast:  Do not put pressure on any part of the cast until it is fully hardened. This may take a few hours.  Do not stick anything inside the cast to scratch your skin.  Check the skin around the cast every day. Tell your doctor about any concerns.  You may put lotion on dry skin around the edges of the cast.  Do not put lotion on the skin under the cast.  Keep the cast clean and dry. Bathing  Do not take baths, swim, or use a hot tub until your doctor says it is okay. Ask your doctor if you can take showers.  If the shoe or cast is not waterproof: ? Do not let it get wet. ? Cover it with a watertight covering when you take a bath or a shower. Activity  Do not use your foot to support your body weight until your doctor says it is okay.  Use crutches as told by your doctor.  Ask your doctor what activities are safe for you  during recovery.  Avoid activities as told by your doctor.  Do exercises as told by your doctor or therapist. Driving  Do not drive or use heavy machinery while taking pain medicine.  Do not drive while wearing a cast on a foot that you use for driving. Managing pain, stiffness, and swelling   Put ice on the injured area if told by your doctor: ? Put ice in a plastic bag. ? Place a towel between your skin and the bag.  If you have a shoe, remove it as told by your doctor.  If you have a cast, place a towel between your cast and the bag. ? Leave the ice on for 20 minutes, 2-3 times per day.  Raise (elevate) the injured area above the level of your heart while you are sitting or lying down. General instructions  If your toe was taped to a toe that is next to it, follow your doctor's instructions for changing the gauze and tape. Change it more often: ? If the gauze and tape get wet. If this happens, dry the space between the toes. ? If the gauze and tape are too tight and they cause your toe to become pale   or to lose feeling (go numb).  If your doctor did not give you a protective shoe, wear sturdy shoes that support your foot. Your shoes should not: ? Pinch your toes. ? Fit tightly against your toes.  Do not use any tobacco products, including cigarettes, chewing tobacco, or e-cigarettes. These can delay bone healing. If you need help quitting, ask your doctor.  Take medicines only as told by your doctor.  Keep all follow-up visits as told by your doctor. This is important. Contact a doctor if:  Your pain medicine is not helping.  You have a fever.  You notice a bad smell coming from your cast. Get help right away if:  You lose feeling (have numbness) in your toe or foot, and it is getting worse.  Your toe or your foot tingles.  Your toe or your foot gets cold or turns blue.  You have redness or swelling in your toe or foot, and it is getting worse.  You have very  bad pain. Summary  A toe fracture is a break in one of the toe bones.  Use ice and raise your foot. This will help lessen pain and swelling.  Use crutches as told by your doctor. This information is not intended to replace advice given to you by your health care provider. Make sure you discuss any questions you have with your health care provider. Document Revised: 05/24/2017 Document Reviewed: 05/01/2017 Elsevier Patient Education  2020 [employer]  

## 2019-09-02 ENCOUNTER — Ambulatory Visit (INDEPENDENT_AMBULATORY_CARE_PROVIDER_SITE_OTHER): Payer: 59 | Admitting: Podiatry

## 2019-09-02 ENCOUNTER — Other Ambulatory Visit: Payer: Self-pay

## 2019-09-02 ENCOUNTER — Ambulatory Visit: Payer: 59

## 2019-09-02 ENCOUNTER — Ambulatory Visit (INDEPENDENT_AMBULATORY_CARE_PROVIDER_SITE_OTHER): Payer: 59

## 2019-09-02 DIAGNOSIS — S92505D Nondisplaced unspecified fracture of left lesser toe(s), subsequent encounter for fracture with routine healing: Secondary | ICD-10-CM

## 2019-09-04 NOTE — Progress Notes (Signed)
Subjective: 59 year old female presents the office today for follow-up evaluation of left fifth toe fracture.  She states that she is doing much better after we did the support last appointment, padding the pain instantly resolved.  No swelling.  No other concerns. Denies any systemic complaints such as fevers, chills, nausea, vomiting. No acute changes since last appointment, and no other complaints at this time.   Objective: AAO x3, NAD DP/PT pulses palpable bilaterally, CRT less than 3 seconds There is no tenderness palpation of the fifth toe there is trace edema.  There is no erythema or warmth.  No other areas of discomfort identified today. No open lesions or pre-ulcerative lesions.  No pain with calf compression, swelling, warmth, erythema  Assessment: Resolved left toe pain  Plan: -All treatment options discussed with the patient including all alternatives, risks, complications.  -Continue offloading.  Discussed shoe modifications.  She can start to transition to regular activities as tolerated. -Patient encouraged to call the office with any questions, concerns, change in symptoms.   Trula Slade DPM

## 2020-01-06 ENCOUNTER — Ambulatory Visit (INDEPENDENT_AMBULATORY_CARE_PROVIDER_SITE_OTHER): Payer: 59 | Admitting: Podiatry

## 2020-01-06 ENCOUNTER — Other Ambulatory Visit: Payer: Self-pay

## 2020-01-06 ENCOUNTER — Ambulatory Visit (INDEPENDENT_AMBULATORY_CARE_PROVIDER_SITE_OTHER): Payer: 59

## 2020-01-06 DIAGNOSIS — M722 Plantar fascial fibromatosis: Secondary | ICD-10-CM | POA: Diagnosis not present

## 2020-01-06 DIAGNOSIS — S92505D Nondisplaced unspecified fracture of left lesser toe(s), subsequent encounter for fracture with routine healing: Secondary | ICD-10-CM

## 2020-01-06 DIAGNOSIS — M779 Enthesopathy, unspecified: Secondary | ICD-10-CM | POA: Diagnosis not present

## 2020-01-06 NOTE — Patient Instructions (Signed)
You can use Voltaren gel to the area daily.   Plantar Fasciitis (Heel Spur Syndrome) with Rehab The plantar fascia is a fibrous, ligament-like, soft-tissue structure that spans the bottom of the foot. Plantar fasciitis is a condition that causes pain in the foot due to inflammation of the tissue. SYMPTOMS   Pain and tenderness on the underneath side of the foot.  Pain that worsens with standing or walking. CAUSES  Plantar fasciitis is caused by irritation and injury to the plantar fascia on the underneath side of the foot. Common mechanisms of injury include:  Direct trauma to bottom of the foot.  Damage to a small nerve that runs under the foot where the main fascia attaches to the heel bone.  Stress placed on the plantar fascia due to bone spurs. RISK INCREASES WITH:   Activities that place stress on the plantar fascia (running, jumping, pivoting, or cutting).  Poor strength and flexibility.  Improperly fitted shoes.  Tight calf muscles.  Flat feet.  Failure to warm-up properly before activity.  Obesity. PREVENTION  Warm up and stretch properly before activity.  Allow for adequate recovery between workouts.  Maintain physical fitness:  Strength, flexibility, and endurance.  Cardiovascular fitness.  Maintain a health body weight.  Avoid stress on the plantar fascia.  Wear properly fitted shoes, including arch supports for individuals who have flat feet.  PROGNOSIS  If treated properly, then the symptoms of plantar fasciitis usually resolve without surgery. However, occasionally surgery is necessary.  RELATED COMPLICATIONS   Recurrent symptoms that may result in a chronic condition.  Problems of the lower back that are caused by compensating for the injury, such as limping.  Pain or weakness of the foot during push-off following surgery.  Chronic inflammation, scarring, and partial or complete fascia tear, occurring more often from repeated  injections.  TREATMENT  Treatment initially involves the use of ice and medication to help reduce pain and inflammation. The use of strengthening and stretching exercises may help reduce pain with activity, especially stretches of the Achilles tendon. These exercises may be performed at home or with a therapist. Your caregiver may recommend that you use heel cups of arch supports to help reduce stress on the plantar fascia. Occasionally, corticosteroid injections are given to reduce inflammation. If symptoms persist for greater than 6 months despite non-surgical (conservative), then surgery may be recommended.   MEDICATION   If pain medication is necessary, then nonsteroidal anti-inflammatory medications, such as aspirin and ibuprofen, or other minor pain relievers, such as acetaminophen, are often recommended.  Do not take pain medication within 7 days before surgery.  Prescription pain relievers may be given if deemed necessary by your caregiver. Use only as directed and only as much as you need.  Corticosteroid injections may be given by your caregiver. These injections should be reserved for the most serious cases, because they may only be given a certain number of times.  HEAT AND COLD  Cold treatment (icing) relieves pain and reduces inflammation. Cold treatment should be applied for 10 to 15 minutes every 2 to 3 hours for inflammation and pain and immediately after any activity that aggravates your symptoms. Use ice packs or massage the area with a piece of ice (ice massage).  Heat treatment may be used prior to performing the stretching and strengthening activities prescribed by your caregiver, physical therapist, or athletic trainer. Use a heat pack or soak the injury in warm water.  SEEK IMMEDIATE MEDICAL CARE IF:  Treatment seems  to offer no benefit, or the condition worsens.  Any medications produce adverse side effects.  EXERCISES- RANGE OF MOTION (ROM) AND STRETCHING  EXERCISES - Plantar Fasciitis (Heel Spur Syndrome) These exercises may help you when beginning to rehabilitate your injury. Your symptoms may resolve with or without further involvement from your physician, physical therapist or athletic trainer. While completing these exercises, remember:   Restoring tissue flexibility helps normal motion to return to the joints. This allows healthier, less painful movement and activity.  An effective stretch should be held for at least 30 seconds.  A stretch should never be painful. You should only feel a gentle lengthening or release in the stretched tissue.  RANGE OF MOTION - Toe Extension, Flexion  Sit with your right / left leg crossed over your opposite knee.  Grasp your toes and gently pull them back toward the top of your foot. You should feel a stretch on the bottom of your toes and/or foot.  Hold this stretch for 10 seconds.  Now, gently pull your toes toward the bottom of your foot. You should feel a stretch on the top of your toes and or foot.  Hold this stretch for 10 seconds. Repeat  times. Complete this stretch 3 times per day.   RANGE OF MOTION - Ankle Dorsiflexion, Active Assisted  Remove shoes and sit on a chair that is preferably not on a carpeted surface.  Place right / left foot under knee. Extend your opposite leg for support.  Keeping your heel down, slide your right / left foot back toward the chair until you feel a stretch at your ankle or calf. If you do not feel a stretch, slide your bottom forward to the edge of the chair, while still keeping your heel down.  Hold this stretch for 10 seconds. Repeat 3 times. Complete this stretch 2 times per day.   STRETCH  Gastroc, Standing  Place hands on wall.  Extend right / left leg, keeping the front knee somewhat bent.  Slightly point your toes inward on your back foot.  Keeping your right / left heel on the floor and your knee straight, shift your weight toward the wall,  not allowing your back to arch.  You should feel a gentle stretch in the right / left calf. Hold this position for 10 seconds. Repeat 3 times. Complete this stretch 2 times per day.  STRETCH  Soleus, Standing  Place hands on wall.  Extend right / left leg, keeping the other knee somewhat bent.  Slightly point your toes inward on your back foot.  Keep your right / left heel on the floor, bend your back knee, and slightly shift your weight over the back leg so that you feel a gentle stretch deep in your back calf.  Hold this position for 10 seconds. Repeat 3 times. Complete this stretch 2 times per day.  STRETCH  Gastrocsoleus, Standing  Note: This exercise can place a lot of stress on your foot and ankle. Please complete this exercise only if specifically instructed by your caregiver.   Place the ball of your right / left foot on a step, keeping your other foot firmly on the same step.  Hold on to the wall or a rail for balance.  Slowly lift your other foot, allowing your body weight to press your heel down over the edge of the step.  You should feel a stretch in your right / left calf.  Hold this position for 10 seconds.  Repeat this exercise with a slight bend in your right / left knee. Repeat 3 times. Complete this stretch 2 times per day.   STRENGTHENING EXERCISES - Plantar Fasciitis (Heel Spur Syndrome)  These exercises may help you when beginning to rehabilitate your injury. They may resolve your symptoms with or without further involvement from your physician, physical therapist or athletic trainer. While completing these exercises, remember:   Muscles can gain both the endurance and the strength needed for everyday activities through controlled exercises.  Complete these exercises as instructed by your physician, physical therapist or athletic trainer. Progress the resistance and repetitions only as guided.  STRENGTH - Towel Curls  Sit in a chair positioned on a  non-carpeted surface.  Place your foot on a towel, keeping your heel on the floor.  Pull the towel toward your heel by only curling your toes. Keep your heel on the floor. Repeat 3 times. Complete this exercise 2 times per day.  STRENGTH - Ankle Inversion  Secure one end of a rubber exercise band/tubing to a fixed object (table, pole). Loop the other end around your foot just before your toes.  Place your fists between your knees. This will focus your strengthening at your ankle.  Slowly, pull your big toe up and in, making sure the band/tubing is positioned to resist the entire motion.  Hold this position for 10 seconds.  Have your muscles resist the band/tubing as it slowly pulls your foot back to the starting position. Repeat 3 times. Complete this exercises 2 times per day.  Document Released: 03/20/2005 Document Revised: 06/12/2011 Document Reviewed: 07/02/2008 [facility] Patient Information 2014 [place], [place].

## 2020-01-07 ENCOUNTER — Telehealth: Payer: Self-pay | Admitting: Podiatry

## 2020-01-07 NOTE — Telephone Encounter (Signed)
Patient called stating that she has a beach trip in 2 weeks and wanted to know if it was alright to walk on the beach. We discussed wearing supportive shoes but also to stretch before/after and ice daily. Voltaren gel as needed. She is going to come by the office tomorrow to get a plantar fascial brace to use until the orthotics come in. Discussed trying to walk on the harder sand and not the soft part.

## 2020-01-08 NOTE — Progress Notes (Signed)
Subjective: 59 year old female presents the office today for concerns of recurrent pain to left fifth toe.  She is concerned that shows no history and causing irritation inside shoes.  She also started developed some pain of the lateral aspect of foot is also to the bottom of the heel.  She denies any recent injury or trauma since I last saw her. She is an avid walker and walks quite a bit daily.  No increase in swelling.Denies any systemic complaints such as fevers, chills, nausea, vomiting. No acute changes since last appointment, and no other complaints at this time.   Objective: AAO x3, NAD DP/PT pulses palpable bilaterally, CRT less than 3 seconds Adductovarus present left fifth toe with mild erythema IPJ.  Minimal tenderness with fifth toe exam.  Also minimal discomfort of the foot metatarsal base the insertion of the peroneal tendon.  Discomfort of the plantar aspect of the calcaneus and insertion of plantar fascia.  No area pinpoint tenderness no pain with lateral compression of calcaneus.  Flexor, extensor tendons appear to be intact. No pain with calf compression, swelling, warmth, erythema  Assessment: 59 year old female with adductovarus left fifth toe secondary to a fracture which is healed; tendinitis/plantar fasciitis  Plan: -All treatment options discussed with the patient including all alternatives, risks, complications.  -Repeat x-rays obtained and reviewed.  No evidence of acute fracture.  The fracture of the fifth toe appears to be healed. -We discussed the conservative possible surgical treatment options.  She was continue conservative care.  We discussed stretching, icing daily.  She was off on oral medication so we discussed Voltaren gel.  New orthotics measured today. -Patient encouraged to call the office with any questions, concerns, change in symptoms.   Trula Slade DPM

## 2020-02-03 ENCOUNTER — Ambulatory Visit: Payer: 59 | Admitting: Orthotics

## 2020-02-03 ENCOUNTER — Other Ambulatory Visit: Payer: Self-pay

## 2020-02-03 DIAGNOSIS — M722 Plantar fascial fibromatosis: Secondary | ICD-10-CM

## 2020-02-03 DIAGNOSIS — S92505D Nondisplaced unspecified fracture of left lesser toe(s), subsequent encounter for fracture with routine healing: Secondary | ICD-10-CM

## 2020-02-03 NOTE — Progress Notes (Signed)
Patient came in today to pick up custom made foot orthotics.  The goals were accomplished and the patient reported no dissatisfaction with said orthotics.  Patient was advised of breakin period and how to report any issues. 

## 2020-03-02 ENCOUNTER — Other Ambulatory Visit: Payer: Self-pay

## 2020-03-02 ENCOUNTER — Ambulatory Visit: Payer: 59 | Admitting: Orthotics

## 2020-03-02 DIAGNOSIS — M722 Plantar fascial fibromatosis: Secondary | ICD-10-CM

## 2020-03-02 DIAGNOSIS — S92505D Nondisplaced unspecified fracture of left lesser toe(s), subsequent encounter for fracture with routine healing: Secondary | ICD-10-CM

## 2020-03-02 NOTE — Progress Notes (Signed)
Adjustments to f/o: Change topcover to vinyl, add 1/16 ppt, make heel more shallow but with cushion, add post medial 4*

## 2020-06-14 ENCOUNTER — Ambulatory Visit (INDEPENDENT_AMBULATORY_CARE_PROVIDER_SITE_OTHER): Payer: 59 | Admitting: Podiatry

## 2020-06-14 ENCOUNTER — Other Ambulatory Visit: Payer: Self-pay

## 2020-06-14 DIAGNOSIS — M779 Enthesopathy, unspecified: Secondary | ICD-10-CM

## 2020-06-14 DIAGNOSIS — M722 Plantar fascial fibromatosis: Secondary | ICD-10-CM

## 2020-06-15 ENCOUNTER — Telehealth: Payer: Self-pay | Admitting: Podiatry

## 2020-06-15 NOTE — Telephone Encounter (Signed)
Order placed for [facility] in [place]

## 2020-06-15 NOTE — Telephone Encounter (Signed)
Patient requesting that she be referred to a physical therapy office in [place] instead of [place]. Patient mentioned she spoke with Izora Gala @ [phone] [facility] about coming there for physical therapy.

## 2020-06-16 NOTE — Telephone Encounter (Signed)
Thank you :)

## 2020-06-20 NOTE — Progress Notes (Signed)
Subjective: 60 year old female presents the office today for evaluation of pain to her left ankle and heel.  She states that she broke her toe feels walking differently causing the discomfort.  She denies any recent injury or trauma otherwise.  No swelling or redness that she reports. Denies any systemic complaints such as fevers, chills, nausea, vomiting. No acute changes since last appointment, and no other complaints at this time.   Objective: AAO x3, NAD DP/PT pulses palpable bilaterally, CRT less than 3 seconds Majority discomfort is on the plantar medial tubercle of the calcaneus at the insertion of plantar fascia.  The plantar fascia appears to be intact.  Mild discomfort on the course of the peroneal tendon but again they appear to be intact.  There is no significant edema, erythema.  Negative Tinel's sign.  Achilles tendon intact.  MMT 5/5. No pain with calf compression, swelling, warmth, erythema  Assessment: Plantar fasciitis, tendinitis likely result of compensation  Plan: -All treatment options discussed with the patient including all alternatives, risks, complications.  -Referral to physical therapy placed today.  Discussed home stretching exercises well.  Discussed supportive shoes, arch supports.  No improvement will get an MRI.  Offered steroid injection today for the heel pain. -Patient encouraged to call the office with any questions, concerns, change in symptoms.   Trula Slade DPM

## 2020-07-06 ENCOUNTER — Encounter (HOSPITAL_COMMUNITY): Payer: Self-pay | Admitting: Physical Therapy

## 2020-07-06 ENCOUNTER — Other Ambulatory Visit: Payer: Self-pay

## 2020-07-06 ENCOUNTER — Ambulatory Visit (HOSPITAL_COMMUNITY): Payer: 59 | Attending: Podiatry | Admitting: Physical Therapy

## 2020-07-06 DIAGNOSIS — M25572 Pain in left ankle and joints of left foot: Secondary | ICD-10-CM | POA: Diagnosis present

## 2020-07-06 DIAGNOSIS — R262 Difficulty in walking, not elsewhere classified: Secondary | ICD-10-CM | POA: Insufficient documentation

## 2020-07-06 NOTE — Therapy (Signed)
[facility] [address] [place], [place], [address] Phone: [phone]   Fax:  [phone]  Physical Therapy Evaluation  Patient Details  Name: Brandi Davis MRN: [id number] Date of Birth: 08-10-1960 Referring Provider (PT): Celesta Gentile DPM   Encounter Date: 07/06/2020   PT End of Session - 07/06/20 0959    Visit Number 1    Number of Visits 8    Date for PT Re-Evaluation 08/03/20    Authorization Type [employer] (30 VL, 0 used)    PT Start Time 0818    PT Stop Time 0912    PT Time Calculation (min) 54 min    Activity Tolerance Patient tolerated treatment well    Behavior During Therapy [facility] for tasks assessed/performed           History reviewed. No pertinent past medical history.  Past Surgical History:  Procedure Laterality Date  . BREAST BIOPSY    . LAPAROSCOPY     x3    There were no vitals filed for this visit.    Subjective Assessment - 07/06/20 0831    Subjective Patient presents to therapy with complaint of LT ankle pain. She says in Nov 2020, she broke her LT small toe. She says this took a while to heal due to decreased bone density. While this was healing, she developed heel pain and was diagnosed with planter fasciitis. She was using orthotics which didn't help very much. Eventually she developed ankle pain. She says she could tell she wasn't walking right which she feels contributed to pain. She was taking an anti-inflammatory which she says was helpful.    Pertinent History Osteopenia    Limitations Standing;Walking;House hold activities    Patient Stated Goals Walk right again with no pain    Currently in Pain? Yes    Pain Score 8     Pain Location Ankle    Pain Orientation Left    Pain Descriptors / Indicators Aching    Pain Type Acute pain    Pain Onset More than a month ago    Pain Frequency Intermittent    Aggravating Factors  walking, WB    Pain Relieving Factors bracing, NSAIDs, ice     Effect of Pain on Daily Activities Limits              [facility] PT Assessment - 07/06/20 0001      Assessment   Medical Diagnosis LT ankle tendonitis    Referring Provider (PT) Celesta Gentile DPM    Prior Therapy No      Balance Screen   Has the patient fallen in the past 6 months No      Prior Function   Level of Independence Independent      Cognition   Overall Cognitive Status Within Functional Limits for tasks assessed      ROM / Strength   AROM / PROM / Strength AROM;Strength      AROM   AROM Assessment Site Ankle    Right/Left Ankle Left    Left Ankle Dorsiflexion 10    Left Ankle Plantar Flexion 50    Left Ankle Inversion 35    Left Ankle Eversion 0      Strength   Strength Assessment Site Hip;Knee;Ankle    Right/Left Hip Right;Left    Right Hip Flexion 5/5    Right Hip Extension 4+/5    Right Hip ABduction 4/5    Left Hip Flexion 5/5  Left Hip Extension 4+/5    Left Hip ABduction 4/5    Right/Left Knee Right;Left    Right Knee Extension 5/5    Left Knee Extension 5/5    Right/Left Ankle Right;Left    Right Ankle Dorsiflexion 5/5    Left Ankle Dorsiflexion 5/5    Left Ankle Plantar Flexion 4+/5    Left Ankle Inversion 4+/5    Left Ankle Eversion 4/5      Flexibility   Soft Tissue Assessment /Muscle Length --   Min restriction in bilateral hip extension     Palpation   Palpation comment Min TTP about LT plantar arch, posterior to LT lateral malleolus      Ambulation/Gait   Ambulation/Gait Yes    Ambulation/Gait Assistance 7: Independent    Gait Comments Slight overpronation in LT ankle      Balance   Balance Assessed Yes      Static Standing Balance   Static Standing Balance -  Activities  Single Leg Stance - Right Leg;Single Leg Stance - Left Leg    Static Standing - Comment/# of Minutes 30 sec, 30 sec min sway                      Objective measurements completed on examination: See above findings.       [facility]  PT Treatment/Exercise - 07/06/20 0001      Exercises   Exercises Ankle      Ankle Exercises: Seated   Towel Crunch 2 reps    Other Seated Ankle Exercises ankle band 4 way GTB x 20 each                  PT Education - 07/06/20 0835    Education Details on evaluation findings, POC and HEP    Person(s) Educated Patient    Methods Explanation;Handout    Comprehension Verbalized understanding            PT Short Term Goals - 07/06/20 1005      PT SHORT TERM GOAL #1   Title Patient will be independent with initial HEP and self-management strategies to improve functional outcomes    Time 2    Period Weeks    Status New    Target Date 07/20/20             PT Long Term Goals - 07/06/20 1005      PT LONG TERM GOAL #1   Title Patient will report at least 80% overall improvement in subjective complaint to indicate improvement in ability to perform ADLs.    Time 4    Period Weeks    Status New    Target Date 08/03/20      PT LONG TERM GOAL #2   Title Patient will be able to maintain 60 seconds in single limb stance on compliant surface to demo improved ankle stability for increased functional mobility outdoors.    Time 4    Period Weeks    Status New    Target Date 08/03/20      PT LONG TERM GOAL #3   Title Patient will be able to walk >30 minutes with no increased ankle pain for improved ability to ambulate in community and outdoors    Time 4    Period Weeks    Status New    Target Date 08/03/20                  Plan -  07/06/20 1000    Clinical Impression Statement Patient is a 60 y.o. female who presents to physical therapy with complaint of LT ankle pain. Patient demonstrates decreased strength, balance deficits and gait abnormalities which are negatively impacting patient ability to perform ADLs and functional mobility tasks. Patient will benefit from skilled physical therapy services to address these deficits to reduce pain and improve level of  function with ADLs and functional mobility tasks.    Personal Factors and Comorbidities Comorbidity 1    Comorbidities osteopenia    Examination-Activity Limitations Locomotion Level;Stand    Examination-Participation Restrictions Community Activity;Yard Work;Cleaning    Stability/Clinical Decision Making Stable/Uncomplicated    Clinical Decision Making Low    Rehab Potential Good    PT Frequency 2x / week    PT Duration 4 weeks    PT Treatment/Interventions ADLs/Self Care Home Management;Aquatic Therapy;Biofeedback;Cryotherapy;Electrical Stimulation;Iontophoresis 4mg /ml Dexamethasone;Moist Heat;Traction;Balance training;Manual techniques;Manual lymph drainage;Therapeutic exercise;Vasopneumatic Device;Taping;Functional mobility training;Therapeutic activities;Orthotic Fit/Training;Energy conservation;Dry needling;Gait training;Stair training;DME Instruction;Patient/family education;Passive range of motion;Spinal Manipulations;Joint Manipulations;Visual/perceptual remediation/compensation;Compression bandaging;Scar mobilization;Neuromuscular re-education;Ultrasound;Parrafin;Fluidtherapy;Contrast Bath    PT Next Visit Plan Review goals and HEP. Progress ankle strength and stability as tolerated. Manual STM as needed for pain and restrictions. Add BAPs, rocker board, tandem stance, step up/ downs, squats    PT Home Exercise Plan Eval: ankle band 4 way, towel scrunch    Consulted and Agree with Plan of Care Patient           Patient will benefit from skilled therapeutic intervention in order to improve the following deficits and impairments:  Abnormal gait,Decreased range of motion,Improper body mechanics,Decreased balance,Difficulty walking,Pain,Decreased activity tolerance,Decreased strength,Increased fascial restricitons  Visit Diagnosis: Pain in left ankle and joints of left foot  Difficulty in walking, not elsewhere classified     Problem List Patient Active Problem List   Diagnosis  Date Noted  . Soft tissue mass, left shoulder 01/01/2013    10:12 AM, 07/06/20 Josue Hector PT DPT  Physical Therapist with [facility]  [phone] [facility] [address] [place], [place], [address] Phone: [phone]   Fax:  [phone]  Name: Brandi Davis MRN: [id number] Date of Birth: [birth year]

## 2020-07-06 NOTE — Patient Instructions (Signed)
Access Code: [email or web address] URL: [email or web address] Date: 07/06/2020 Prepared by: Josue Hector  Exercises Seated Toe Towel Scrunches - 3 x daily - 7 x weekly - 1 sets - 1 reps - 3-5 minutes hold Ankle Dorsiflexion with Resistance - 3 x daily - 7 x weekly - 3 sets - 10 reps Ankle Eversion with Resistance - 3 x daily - 7 x weekly - 3 sets - 10 reps Ankle Inversion with Resistance - 3 x daily - 7 x weekly - 3 sets - 10 reps Ankle and Toe Plantarflexion with Resistance - 3 x daily - 7 x weekly - 3 sets - 10 reps

## 2020-07-08 ENCOUNTER — Encounter (HOSPITAL_COMMUNITY): Payer: Self-pay | Admitting: Physical Therapy

## 2020-07-08 ENCOUNTER — Other Ambulatory Visit: Payer: Self-pay

## 2020-07-08 ENCOUNTER — Ambulatory Visit (HOSPITAL_COMMUNITY): Payer: 59 | Admitting: Physical Therapy

## 2020-07-08 DIAGNOSIS — M25572 Pain in left ankle and joints of left foot: Secondary | ICD-10-CM

## 2020-07-08 DIAGNOSIS — R262 Difficulty in walking, not elsewhere classified: Secondary | ICD-10-CM

## 2020-07-08 NOTE — Patient Instructions (Signed)
Access Code: [email or web address] URL: [email or web address] Date: 07/08/2020 Prepared by: Josue Hector  Exercises Ankle Inversion Eversion Towel Slide - 3 x daily - 7 x weekly - 3 sets - 10 reps Tandem Stance - 3 x daily - 7 x weekly - 1 sets - 3 reps - 30 seconds hold

## 2020-07-08 NOTE — Therapy (Signed)
[place], [place], [address] Phone: [phone]   Fax:  [phone]  Physical Therapy Treatment  Patient Details  Name: Brandi Davis MRN: [id number] Date of Birth: 1960/07/22 Referring Provider (PT): Celesta Gentile DPM   Encounter Date: 07/08/2020   PT End of Session - 07/08/20 0827    Visit Number 2    Number of Visits 8    Date for PT Re-Evaluation 08/03/20    Authorization Type [employer] (30 VL, 0 used)    Authorization - Visit Number 2    Authorization - Number of Visits 30    PT Start Time 0820    PT Stop Time 0910    PT Time Calculation (min) 50 min    Activity Tolerance Patient tolerated treatment well    Behavior During Therapy [facility] for tasks assessed/performed           History reviewed. No pertinent past medical history.  Past Surgical History:  Procedure Laterality Date  . BREAST BIOPSY    . LAPAROSCOPY     x3    There were no vitals filed for this visit.   Subjective Assessment - 07/08/20 0826    Subjective Patient reports compliance with HEP. She says she feels she may be walking straighter. A little soreness in back of heel today.    Pertinent History Osteopenia    Limitations Standing;Walking;House hold activities    Patient Stated Goals Walk right again with no pain    Currently in Pain? Yes    Pain Score 4     Pain Location Ankle    Pain Orientation Left    Pain Descriptors / Indicators Sore    Pain Type Acute pain    Pain Onset More than a month ago    Pain Frequency Intermittent                             [facility] Adult PT Treatment/Exercise - 07/08/20 0001      Ankle Exercises: Seated   Towel Crunch 4 reps    BAPS Sitting;Level 2   20 x each INV/EV/PF/DF, circles CW/CCW   Other Seated Ankle Exercises ankle band 4 way GTB x 30 each    Other Seated Ankle Exercises windshield wipers x 20 on LT      Ankle Exercises: Standing   Other Standing  Ankle Exercises tandem stance 2 x 30" each                    PT Short Term Goals - 07/06/20 1005      PT SHORT TERM GOAL #1   Title Patient will be independent with initial HEP and self-management strategies to improve functional outcomes    Time 2    Period Weeks    Status New    Target Date 07/20/20             PT Long Term Goals - 07/06/20 1005      PT LONG TERM GOAL #1   Title Patient will report at least 80% overall improvement in subjective complaint to indicate improvement in ability to perform ADLs.    Time 4    Period Weeks    Status New    Target Date 08/03/20      PT LONG TERM GOAL #2   Title Patient will be able to maintain 60 seconds in single limb stance on compliant surface  to demo improved ankle stability for increased functional mobility outdoors.    Time 4    Period Weeks    Status New    Target Date 08/03/20      PT LONG TERM GOAL #3   Title Patient will be able to walk >30 minutes with no increased ankle pain for improved ability to ambulate in community and outdoors    Time 4    Period Weeks    Status New    Target Date 08/03/20                 Plan - 07/08/20 1656    Clinical Impression Statement Initiated ther ex today. Reviewed goals and prior HEP. Patient shows fairly good return but does require verbal cues for set up and body mechanics. Progressed ankle stabilization activity with added BAPs board and tandem standing. Patient tolerated these well but continues to have difficulty isolating ankle eversion on LT and tends to move entire leg when attempting. Patient did better with this during added ankle windshield wipers. Added this and tandem stance to HEP. Patient will continue to benefit from skilled therapy services to reduce limitations and improve functional ability.    Personal Factors and Comorbidities Comorbidity 1    Comorbidities osteopenia    Examination-Activity Limitations Locomotion Level;Stand     Examination-Participation Restrictions Community Activity;Yard Work;Cleaning    Stability/Clinical Decision Making Stable/Uncomplicated    Rehab Potential Good    PT Frequency 2x / week    PT Duration 4 weeks    PT Treatment/Interventions ADLs/Self Care Home Management;Aquatic Therapy;Biofeedback;Cryotherapy;Electrical Stimulation;Iontophoresis 4mg /ml Dexamethasone;Moist Heat;Traction;Balance training;Manual techniques;Manual lymph drainage;Therapeutic exercise;Vasopneumatic Device;Taping;Functional mobility training;Therapeutic activities;Orthotic Fit/Training;Energy conservation;Dry needling;Gait training;Stair training;DME Instruction;Patient/family education;Passive range of motion;Spinal Manipulations;Joint Manipulations;Visual/perceptual remediation/compensation;Compression bandaging;Scar mobilization;Neuromuscular re-education;Ultrasound;Parrafin;Fluidtherapy;Contrast Bath    PT Next Visit Plan F/U about ankle brace.Progress ankle strength and stability as tolerated. Manual STM as needed for pain and restrictions. Add rocker board, step up/ downs, squats    PT Home Exercise Plan Eval: ankle band 4 way, towel scrunch 4/7 windshield wipers, tandem stance    Consulted and Agree with Plan of Care Patient           Patient will benefit from skilled therapeutic intervention in order to improve the following deficits and impairments:  Abnormal gait,Decreased range of motion,Improper body mechanics,Decreased balance,Difficulty walking,Pain,Decreased activity tolerance,Decreased strength,Increased fascial restricitons  Visit Diagnosis: Pain in left ankle and joints of left foot  Difficulty in walking, not elsewhere classified     Problem List Patient Active Problem List   Diagnosis Date Noted  . Soft tissue mass, left shoulder 01/01/2013   5:04 PM, 07/08/20 Josue Hector PT DPT  Physical Therapist with [facility]  [phone] [place] [address] [place], [place], [address] Phone: [phone]   Fax:  [phone]  Name: Brandi Davis MRN: [id number] Date of Birth: 1961-01-28

## 2020-07-12 ENCOUNTER — Telehealth: Payer: Self-pay | Admitting: Podiatry

## 2020-07-12 NOTE — Telephone Encounter (Signed)
Pt left message stating she started physical therapy like Dr Jacqualyn Posey requested and the therapist has told her the strap for the plantar facial brace is too short for her. She is not sure if she needs to go up a size(she is currently in a small/medicum brace) or if it was a defect in the brace she received.

## 2020-07-12 NOTE — Telephone Encounter (Signed)
Spoke to patient to let her know that we can give her a longer strap for the plantar fasciitis brace. Patient will come by this afternoon to pick up a large/Xlarge brace that will have a longer strap on it.

## 2020-07-12 NOTE — Telephone Encounter (Signed)
Patient arrived to pick up the large/Xlarge plantar fasciitis brace. Had patient try on the new brace to insure it fit properly. The new brace fits and patient is happy with the length of the new strap.

## 2020-07-13 ENCOUNTER — Encounter (HOSPITAL_COMMUNITY): Payer: 59 | Admitting: Physical Therapy

## 2020-07-14 IMAGING — [facility] DIGITAL DIAGNOSTIC UNILATERAL RIGHT MAMMOGRAM WITH TOMO AND CAD
4 series · 4 of 12 positions shown · non-contrast
Comparison: December 06, 2017 and earlier priors

CLINICAL DATA: 56-year-old patient recalled from recent screening
mammogram for evaluation of an asymmetry in the outer right breast
identified only on the CC projection.

EXAM:
DIGITAL DIAGNOSTIC UNILATERAL RIGHT MAMMOGRAM WITH CAD AND TOMO

[R CC synth-2D]
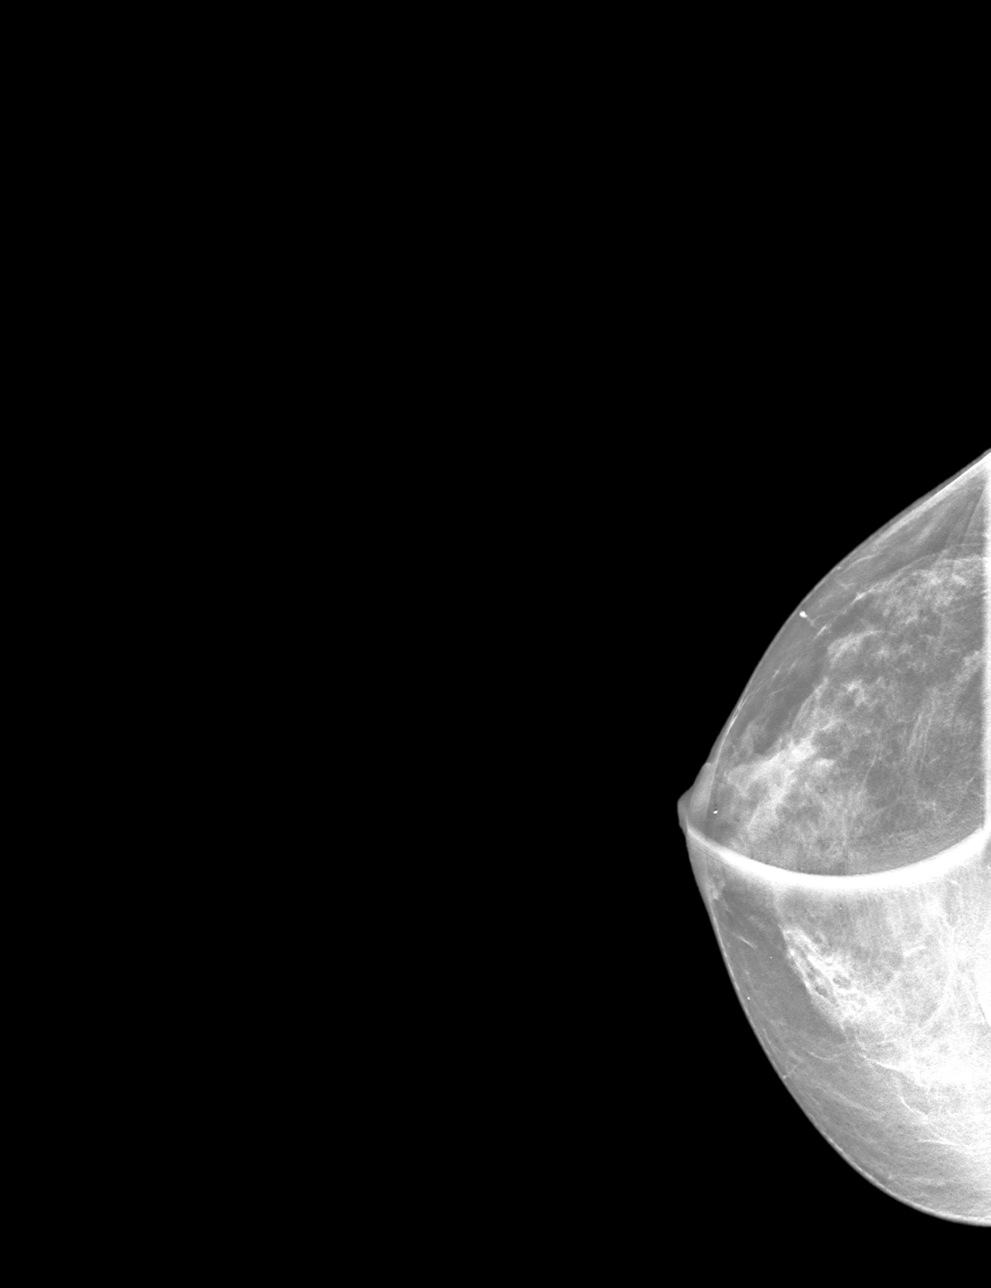

[R ML synth-2D]
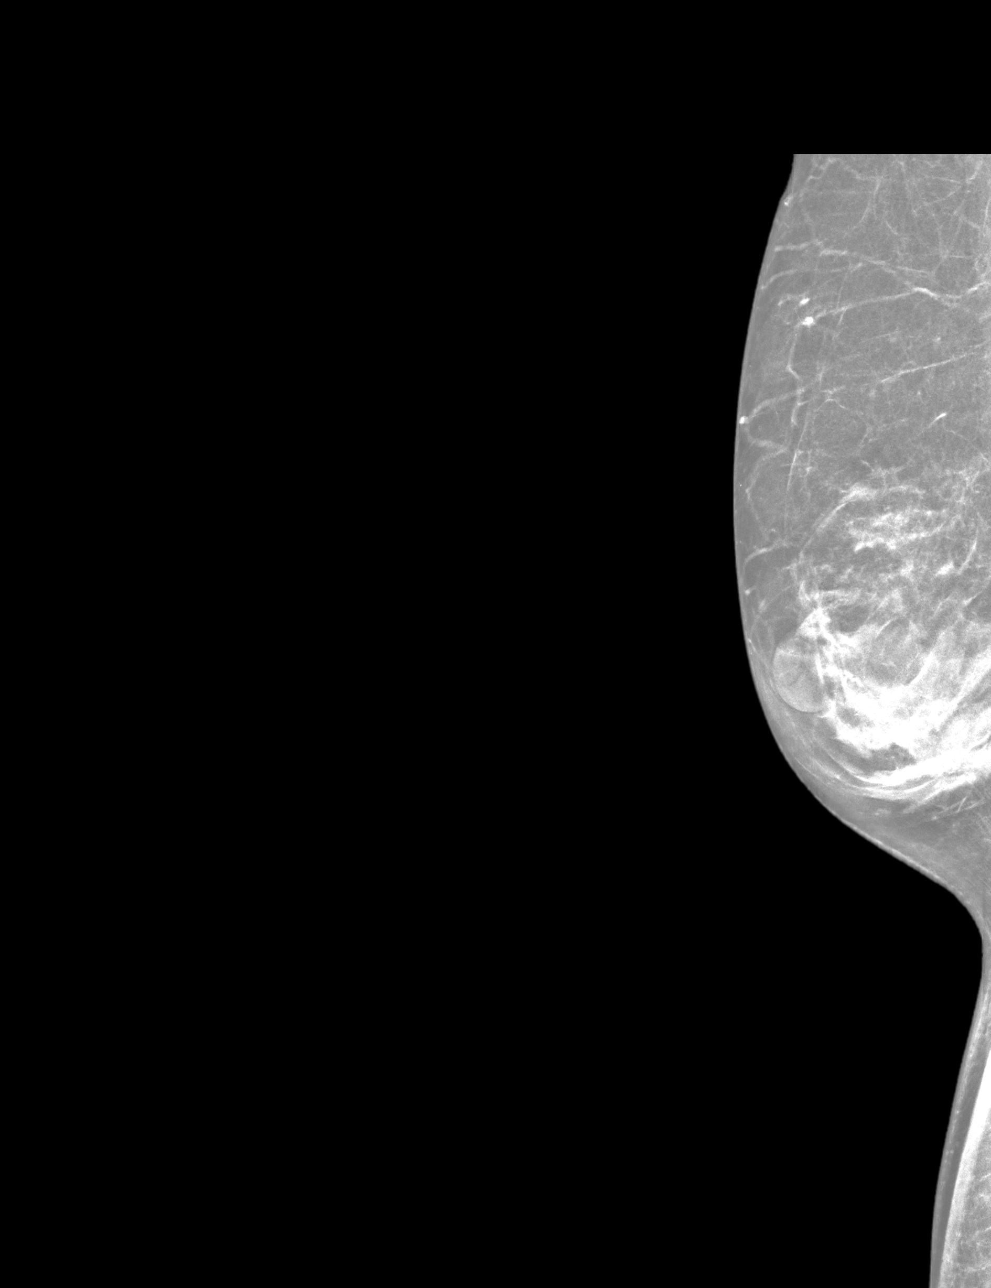

[R ML tomo · tomo slice 25/48.0]
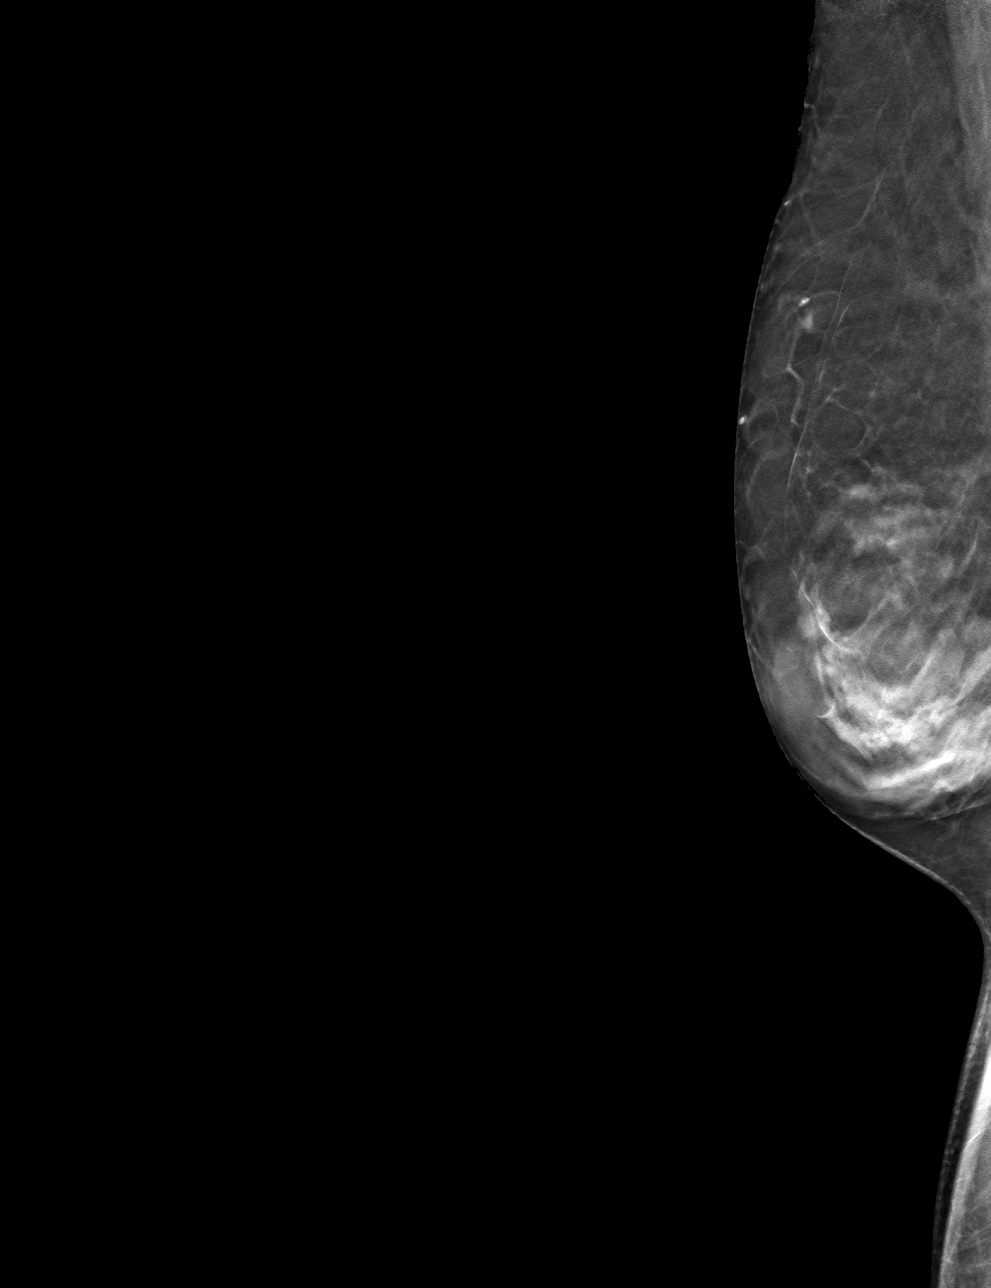

[R CC tomo · tomo slice 21/42.0]
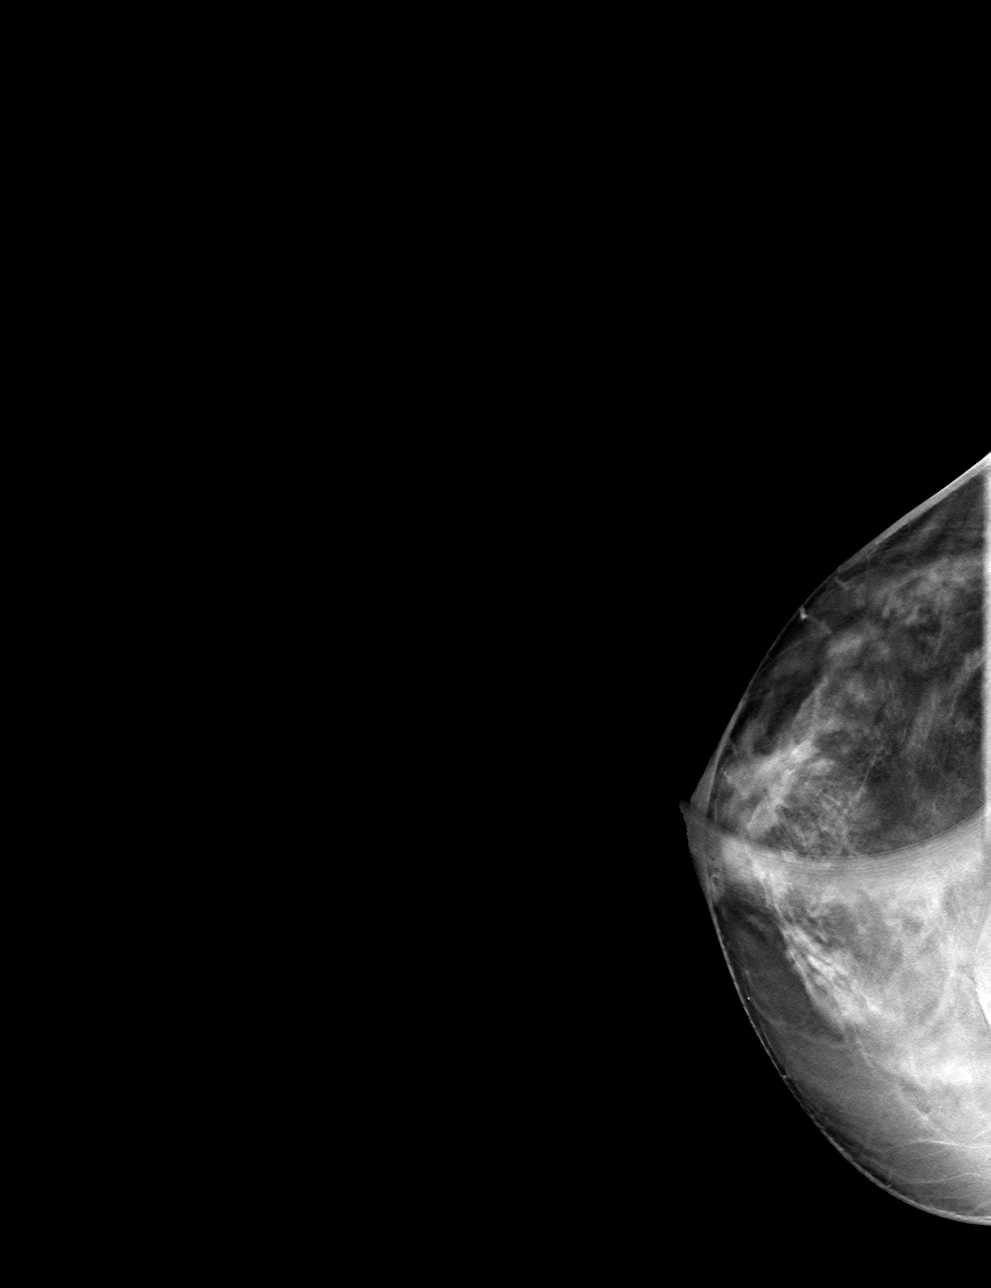

[4 of 12 positions shown; findings below may reference images not displayed]

ACR Breast Density Category c: The breast tissue is heterogeneously
dense, which may obscure small masses.
FINDINGS: Focal spot compression view with tomography of the outer right
breast shows dispersion of fibroglandular tissue. No persistent
asymmetry, mass, or distortion. 90 degree lateral view of the right
breast with tomography is negative.

Mammographic images were processed with CAD.
IMPRESSION: No evidence of malignancy in the right breast.

RECOMMENDATION:
Screening mammogram in one year.(Code:[id number])

I have discussed the findings and recommendations with the patient.
Results were also provided in writing at the conclusion of the
visit. If applicable, a reminder letter will be sent to the patient
regarding the next appointment.

BI-RADS CATEGORY  1: Negative.

## 2020-07-15 ENCOUNTER — Ambulatory Visit (HOSPITAL_COMMUNITY): Payer: 59

## 2020-07-15 ENCOUNTER — Other Ambulatory Visit: Payer: Self-pay

## 2020-07-15 ENCOUNTER — Encounter (HOSPITAL_COMMUNITY): Payer: Self-pay

## 2020-07-15 DIAGNOSIS — M25572 Pain in left ankle and joints of left foot: Secondary | ICD-10-CM

## 2020-07-15 DIAGNOSIS — R262 Difficulty in walking, not elsewhere classified: Secondary | ICD-10-CM

## 2020-07-15 NOTE — Therapy (Signed)
[facility] [address] [place], [place], [address] Phone: [phone]   Fax:  [phone]  Physical Therapy Treatment  Patient Details  Name: Brandi Davis MRN: [id number] Date of Birth: 10-11-1960 Referring Provider (PT): Celesta Gentile DPM   Encounter Date: 07/15/2020   PT End of Session - 07/15/20 0837    Visit Number 3    Number of Visits 8    Date for PT Re-Evaluation 08/03/20    Authorization Type [employer] (30 VL, 0 used)    Authorization - Visit Number 3    Authorization - Number of Visits 30    PT Start Time 0831    PT Stop Time 0912    PT Time Calculation (min) 41 min    Activity Tolerance Patient tolerated treatment well    Behavior During Therapy [facility] for tasks assessed/performed           History reviewed. No pertinent past medical history.  Past Surgical History:  Procedure Laterality Date  . BREAST BIOPSY    . LAPAROSCOPY     x3    There were no vitals filed for this visit.   Subjective Assessment - 07/15/20 0834    Subjective Pt reports she is feeling good, can tell she is making progress with therapy.  Stated she wore a boot on Thurdsay and has increased soreness.  Contacted MD and was issued new brace and reports she can tell benefits.    Pertinent History Osteopenia    Patient Stated Goals Walk right again with no pain    Currently in Pain? Yes    Pain Score 3     Pain Location Ankle    Pain Orientation Left    Pain Descriptors / Indicators Aching;Sore    Pain Type Acute pain    Pain Onset More than a month ago    Pain Frequency Intermittent    Aggravating Factors  walking, WB    Pain Relieving Factors bracing, NSAIDs, ice    Effect of Pain on Daily Activities Limits              [facility] PT Assessment - 07/15/20 0001      Assessment   Medical Diagnosis LT ankle tendonitis    Referring Provider (PT) Celesta Gentile DPM    Next MD Visit not scheduled                          [facility] Adult PT Treatment/Exercise - 07/15/20 0001      Exercises   Exercises Ankle      Ankle Exercises: Standing   Rocker Board 2 minutes    Rocker Board Limitations lateral and DF/PF    Heel Raises 10 reps    Toe Raise 10 reps      Ankle Exercises: Seated   Towel Inversion/Eversion 3 reps    Towel Inversion/Eversion Limitations eversion    BAPS Sitting;Level 3;10 reps    BAPS Limitations DF/PF; Inv/Ev; CW/CCW                    PT Short Term Goals - 07/06/20 1005      PT SHORT TERM GOAL #1   Title Patient will be independent with initial HEP and self-management strategies to improve functional outcomes    Time 2    Period Weeks    Status New    Target Date 07/20/20  PT Long Term Goals - 07/06/20 1005      PT LONG TERM GOAL #1   Title Patient will report at least 80% overall improvement in subjective complaint to indicate improvement in ability to perform ADLs.    Time 4    Period Weeks    Status New    Target Date 08/03/20      PT LONG TERM GOAL #2   Title Patient will be able to maintain 60 seconds in single limb stance on compliant surface to demo improved ankle stability for increased functional mobility outdoors.    Time 4    Period Weeks    Status New    Target Date 08/03/20      PT LONG TERM GOAL #3   Title Patient will be able to walk >30 minutes with no increased ankle pain for improved ability to ambulate in community and outdoors    Time 4    Period Weeks    Status New    Target Date 08/03/20                 Plan - 07/15/20 0859    Clinical Impression Statement Pt arrived wiht new ankle brace and reports of vast improvement with fit and comfort.  Added heel and toe raises for gastroc/anterior tib strengthening and rockerboard to improve weight distribution and gait mechanics.  Added towel eversion to improve motor control with eversion motion, pt encouraged to stabilize knee to reduce  compensation.    Personal Factors and Comorbidities Comorbidity 1    Comorbidities osteopenia    Examination-Activity Limitations Locomotion Level;Stand    Examination-Participation Restrictions Community Activity;Yard Work;Cleaning    Stability/Clinical Decision Making Stable/Uncomplicated    Clinical Decision Making Low    Rehab Potential Good    PT Frequency 2x / week    PT Duration 4 weeks    PT Treatment/Interventions ADLs/Self Care Home Management;Aquatic Therapy;Biofeedback;Cryotherapy;Electrical Stimulation;Iontophoresis 4mg /ml Dexamethasone;Moist Heat;Traction;Balance training;Manual techniques;Manual lymph drainage;Therapeutic exercise;Vasopneumatic Device;Taping;Functional mobility training;Therapeutic activities;Orthotic Fit/Training;Energy conservation;Dry needling;Gait training;Stair training;DME Instruction;Patient/family education;Passive range of motion;Spinal Manipulations;Joint Manipulations;Visual/perceptual remediation/compensation;Compression bandaging;Scar mobilization;Neuromuscular re-education;Ultrasound;Parrafin;Fluidtherapy;Contrast Bath    PT Next Visit Plan Add foam with tandem stance.  Progress ankle strength and stability as tolerated. Manual STM as needed for pain and restrictions. Begin step up/ downs, squats    PT Home Exercise Plan Eval: ankle band 4 way, towel scrunch 4/7 windshield wipers, tandem stance    Consulted and Agree with Plan of Care Patient           Patient will benefit from skilled therapeutic intervention in order to improve the following deficits and impairments:  Abnormal gait,Decreased range of motion,Improper body mechanics,Decreased balance,Difficulty walking,Pain,Decreased activity tolerance,Decreased strength,Increased fascial restricitons  Visit Diagnosis: Difficulty in walking, not elsewhere classified  Pain in left ankle and joints of left foot     Problem List Patient Active Problem List   Diagnosis Date Noted  . Soft  tissue mass, left shoulder 01/01/2013   Ihor Austin, LPTA/CLT; CBIS [phone]  Aldona Lento 07/15/2020, 3:49 PM  [facility] [facility] [address] [place], [place], [address] Phone: [phone]   Fax:  [phone]  Name: Brandi Davis MRN: [id number] Date of Birth: 18-Sep-1960

## 2020-07-20 ENCOUNTER — Ambulatory Visit (HOSPITAL_COMMUNITY): Payer: 59 | Admitting: Physical Therapy

## 2020-07-20 ENCOUNTER — Other Ambulatory Visit: Payer: Self-pay

## 2020-07-20 DIAGNOSIS — M25572 Pain in left ankle and joints of left foot: Secondary | ICD-10-CM

## 2020-07-20 DIAGNOSIS — R262 Difficulty in walking, not elsewhere classified: Secondary | ICD-10-CM

## 2020-07-20 NOTE — Therapy (Signed)
[facility] [address] [place], [place], [address] Phone: [phone]   Fax:  [phone]  Physical Therapy Treatment  Patient Details  Name: Brandi Davis MRN: [id number] Date of Birth: 1960-06-11 Referring Provider (PT): Celesta Gentile DPM   Encounter Date: 07/20/2020   PT End of Session - 07/20/20 0944    Visit Number 4    Number of Visits 8    Date for PT Re-Evaluation 08/03/20    Authorization Type [employer] (30 VL, 0 used)    Authorization - Visit Number 4    Authorization - Number of Visits 30    PT Start Time [phone]    PT Stop Time 0915    PT Time Calculation (min) 40 min    Activity Tolerance Patient tolerated treatment well    Behavior During Therapy [facility] for tasks assessed/performed           No past medical history on file.  Past Surgical History:  Procedure Laterality Date  . BREAST BIOPSY    . LAPAROSCOPY     x3    There were no vitals filed for this visit.   Subjective Assessment - 07/20/20 0843    Subjective pt states she was sore but the massage has helped that [facility] instructed.  States she's been doing classes at [employer] and walking 3 miles a day.    Currently in Pain? No/denies                             [facility] Adult PT Treatment/Exercise - 07/20/20 0001      Ankle Exercises: Standing   BAPS Standing;Level 2;10 reps    Rocker Board 2 minutes    Rocker Board Limitations lateral and DF/PF    Heel Raises 15 reps    Toe Raise 15 reps    Other Standing Ankle Exercises tandem on foam 30"x 2 each, squats 15 reps    Other Standing Ankle Exercises step up forward , lateral and step downs 7" step with 1 HR 10X eccentric control                    PT Short Term Goals - 07/06/20 1005      PT SHORT TERM GOAL #1   Title Patient will be independent with initial HEP and self-management strategies to improve functional outcomes    Time 2    Period Weeks     Status New    Target Date 07/20/20             PT Long Term Goals - 07/06/20 1005      PT LONG TERM GOAL #1   Title Patient will report at least 80% overall improvement in subjective complaint to indicate improvement in ability to perform ADLs.    Time 4    Period Weeks    Status New    Target Date 08/03/20      PT LONG TERM GOAL #2   Title Patient will be able to maintain 60 seconds in single limb stance on compliant surface to demo improved ankle stability for increased functional mobility outdoors.    Time 4    Period Weeks    Status New    Target Date 08/03/20      PT LONG TERM GOAL #3   Title Patient will be able to walk >30 minutes with no increased ankle pain for improved ability to ambulate  in community and outdoors    Time 4    Period Weeks    Status New    Target Date 08/03/20                 Plan - 07/20/20 0949    Clinical Impression Statement Pt removed her ankle brace for therapy today. OVerall doing great without pain or soreness today.  Completing regular workouts without difficulty.  Progressed to BAPS in standing at level 2 with good control/motion in ankle, however manual cues from therapist to reduce body motion/compensation.  Began step ups/downs working on eccentric control and foot/ankle mechanics.  Squats completed in good form as well.  Foam added to tandem stance with no difficulty maintaining full 30 second holds without UE assistance.   No increased pain, soreness or issues during or at end of session today.    Personal Factors and Comorbidities Comorbidity 1    Comorbidities osteopenia    Examination-Activity Limitations Locomotion Level;Stand    Examination-Participation Restrictions Community Activity;Yard Work;Cleaning    Stability/Clinical Decision Making Stable/Uncomplicated    Rehab Potential Good    PT Frequency 2x / week    PT Duration 4 weeks    PT Treatment/Interventions ADLs/Self Care Home Management;Aquatic  Therapy;Biofeedback;Cryotherapy;Electrical Stimulation;Iontophoresis 4mg /ml Dexamethasone;Moist Heat;Traction;Balance training;Manual techniques;Manual lymph drainage;Therapeutic exercise;Vasopneumatic Device;Taping;Functional mobility training;Therapeutic activities;Orthotic Fit/Training;Energy conservation;Dry needling;Gait training;Stair training;DME Instruction;Patient/family education;Passive range of motion;Spinal Manipulations;Joint Manipulations;Visual/perceptual remediation/compensation;Compression bandaging;Scar mobilization;Neuromuscular re-education;Ultrasound;Parrafin;Fluidtherapy;Contrast Bath    PT Next Visit Plan Progress ankle strength and stability as tolerated. Manual STM as needed for pain and restrictions    PT Home Exercise Plan Eval: ankle band 4 way, towel scrunch 4/7 windshield wipers, tandem stance    Consulted and Agree with Plan of Care Patient           Patient will benefit from skilled therapeutic intervention in order to improve the following deficits and impairments:  Abnormal gait,Decreased range of motion,Improper body mechanics,Decreased balance,Difficulty walking,Pain,Decreased activity tolerance,Decreased strength,Increased fascial restricitons  Visit Diagnosis: Pain in left ankle and joints of left foot  Difficulty in walking, not elsewhere classified     Problem List Patient Active Problem List   Diagnosis Date Noted  . Soft tissue mass, left shoulder 01/01/2013   Brandi Davis, PTA/CLT [phone]  Brandi Davis 07/20/2020, 10:01 AM  [facility] [place], [place], [address] Phone: [phone]   Fax:  [phone]  Name: Brandi Davis MRN: [id number] Date of Birth: 1960-08-23

## 2020-07-22 ENCOUNTER — Other Ambulatory Visit: Payer: Self-pay

## 2020-07-22 ENCOUNTER — Ambulatory Visit (HOSPITAL_COMMUNITY): Payer: 59 | Admitting: Physical Therapy

## 2020-07-22 DIAGNOSIS — M25572 Pain in left ankle and joints of left foot: Secondary | ICD-10-CM

## 2020-07-22 DIAGNOSIS — R262 Difficulty in walking, not elsewhere classified: Secondary | ICD-10-CM

## 2020-07-22 NOTE — Patient Instructions (Signed)
Access Code: [email or web address] URL: [email or web address] Date: 07/22/2020 Prepared by: Josue Hector  Exercises Standing Bilateral Gastroc Stretch with Step - 2 x daily - 7 x weekly - 1 sets - 3 reps - 30 seconds hold Tandem Stance - 2 x daily - 7 x weekly - 1 sets - 3 reps - 30 seconds hold

## 2020-07-22 NOTE — Therapy (Signed)
[place], [place], [address] Phone: [phone]   Fax:  [phone]  Physical Therapy Treatment  Patient Details  Name: Brandi Davis MRN: [id number] Date of Birth: 1960/08/16 Referring Provider (PT): Celesta Gentile DPM   Encounter Date: 07/22/2020   PT End of Session - 07/22/20 1044    Visit Number 5    Number of Visits 8    Date for PT Re-Evaluation 08/03/20    Authorization Type [employer] (30 VL, 0 used)    Authorization - Visit Number 5    Authorization - Number of Visits 30    PT Start Time 1033    PT Stop Time 1112    PT Time Calculation (min) 39 min    Activity Tolerance Patient tolerated treatment well    Behavior During Therapy [facility] for tasks assessed/performed           No past medical history on file.  Past Surgical History:  Procedure Laterality Date  . BREAST BIOPSY    . LAPAROSCOPY     x3    There were no vitals filed for this visit.   Subjective Assessment - 07/22/20 1041    Subjective Patient says she is doing much better. Less pain in foot, just feels in heel now. Got a new strap for her brace which has helped.    Currently in Pain? Yes    Pain Score 3     Pain Location Ankle    Pain Orientation Left    Pain Descriptors / Indicators Aching;Sore    Pain Type Acute pain    Pain Onset More than a month ago    Pain Frequency Intermittent                             [facility] Adult PT Treatment/Exercise - 07/22/20 0001      Ankle Exercises: Aerobic   Recumbent Bike 4 min lv 2 dynamic warmup      Ankle Exercises: Stretches   Gastroc Stretch 3 reps;30 seconds      Ankle Exercises: Standing   BAPS Standing;Level 2;10 reps    SLS 3 x 20" on foam    Heel Raises 20 reps    Other Standing Ankle Exercises tandem on 1/2 foam roll 3 x 30"    Other Standing Ankle Exercises eccentric step down from 6 inch box x20, band sidestepping GTB 2RT                     PT Short Term Goals - 07/06/20 1005      PT SHORT TERM GOAL #1   Title Patient will be independent with initial HEP and self-management strategies to improve functional outcomes    Time 2    Period Weeks    Status New    Target Date 07/20/20             PT Long Term Goals - 07/06/20 1005      PT LONG TERM GOAL #1   Title Patient will report at least 80% overall improvement in subjective complaint to indicate improvement in ability to perform ADLs.    Time 4    Period Weeks    Status New    Target Date 08/03/20      PT LONG TERM GOAL #2   Title Patient will be able to maintain 60 seconds in single limb stance on compliant surface  to demo improved ankle stability for increased functional mobility outdoors.    Time 4    Period Weeks    Status New    Target Date 08/03/20      PT LONG TERM GOAL #3   Title Patient will be able to walk >30 minutes with no increased ankle pain for improved ability to ambulate in community and outdoors    Time 4    Period Weeks    Status New    Target Date 08/03/20                 Plan - 07/22/20 1109    Clinical Impression Statement Patient tolerated session well today with minimal complaint. Patient reports pain reduction to 1/10 at lateral malleolus end of session. Progressed ankle stabilization with single leg balance on foam and added band sidestepping. Updated HEP and issued handout. Patient will continue to benefit from skilled therapy services to reduce deficits and improve functional ability.    Personal Factors and Comorbidities Comorbidity 1    Comorbidities osteopenia    Examination-Activity Limitations Locomotion Level;Stand    Examination-Participation Restrictions Community Activity;Yard Work;Cleaning    Stability/Clinical Decision Making Stable/Uncomplicated    Rehab Potential Good    PT Frequency 2x / week    PT Duration 4 weeks    PT Treatment/Interventions ADLs/Self Care Home Management;Aquatic  Therapy;Biofeedback;Cryotherapy;Electrical Stimulation;Iontophoresis 4mg /ml Dexamethasone;Moist Heat;Traction;Balance training;Manual techniques;Manual lymph drainage;Therapeutic exercise;Vasopneumatic Device;Taping;Functional mobility training;Therapeutic activities;Orthotic Fit/Training;Energy conservation;Dry needling;Gait training;Stair training;DME Instruction;Patient/family education;Passive range of motion;Spinal Manipulations;Joint Manipulations;Visual/perceptual remediation/compensation;Compression bandaging;Scar mobilization;Neuromuscular re-education;Ultrasound;Parrafin;Fluidtherapy;Contrast Bath    PT Next Visit Plan Progress ankle strength and stability as tolerated. Manual STM as needed for pain and restrictions. Add SLS vectors, monster walk    PT Home Exercise Plan Eval: ankle band 4 way, towel scrunch 4/7 windshield wipers, tandem stance 4/21 calf stretch at step, tandem on towel or pillow    Consulted and Agree with Plan of Care Patient           Patient will benefit from skilled therapeutic intervention in order to improve the following deficits and impairments:  Abnormal gait,Decreased range of motion,Improper body mechanics,Decreased balance,Difficulty walking,Pain,Decreased activity tolerance,Decreased strength,Increased fascial restricitons  Visit Diagnosis: Pain in left ankle and joints of left foot  Difficulty in walking, not elsewhere classified     Problem List Patient Active Problem List   Diagnosis Date Noted  . Soft tissue mass, left shoulder 01/01/2013    1:30 PM, 07/22/20 Josue Hector PT DPT  Physical Therapist with [facility]  [phone] [place] [address] [place], [place], [address] Phone: [phone]   Fax:  [phone]  Name: Brandi Davis MRN: [id number] Date of Birth: [birth year]

## 2020-07-27 ENCOUNTER — Other Ambulatory Visit: Payer: Self-pay

## 2020-07-27 ENCOUNTER — Encounter (HOSPITAL_COMMUNITY): Payer: Self-pay | Admitting: Physical Therapy

## 2020-07-27 ENCOUNTER — Ambulatory Visit (HOSPITAL_COMMUNITY): Payer: 59 | Admitting: Physical Therapy

## 2020-07-27 DIAGNOSIS — M25572 Pain in left ankle and joints of left foot: Secondary | ICD-10-CM

## 2020-07-27 DIAGNOSIS — R262 Difficulty in walking, not elsewhere classified: Secondary | ICD-10-CM

## 2020-07-27 NOTE — Therapy (Signed)
[facility] [address] [place], [place], [address] Phone: [phone]   Fax:  [phone]  Physical Therapy Treatment  Patient Details  Name: Brandi Davis MRN: [id number] Date of Birth: 25-May-1960 Referring Provider (PT): Celesta Gentile DPM   Encounter Date: 07/27/2020   PT End of Session - 07/27/20 0821    Visit Number 6    Number of Visits 8    Date for PT Re-Evaluation 08/03/20    Authorization Type [employer] (30 VL, 0 used)    Authorization - Visit Number 6    Authorization - Number of Visits 30    PT Start Time [phone]    PT Stop Time 0858    PT Time Calculation (min) 41 min    Activity Tolerance Patient tolerated treatment well    Behavior During Therapy [facility] for tasks assessed/performed           History reviewed. No pertinent past medical history.  Past Surgical History:  Procedure Laterality Date  . BREAST BIOPSY    . LAPAROSCOPY     x3    There were no vitals filed for this visit.   Subjective Assessment - 07/27/20 0820    Subjective Patient says her foot is much better. Still has some pain after exercises, but not as bad. She has walked every day this week with no issues. No pain currently.    Currently in Pain? No/denies    Pain Onset More than a month ago                             [facility] Adult PT Treatment/Exercise - 07/27/20 0001      Ankle Exercises: Aerobic   Recumbent Bike 4 min lv 3 dynamic warmup      Ankle Exercises: Stretches   Gastroc Stretch 3 reps;30 seconds   from step     Ankle Exercises: Standing   SLS 3 x 30" on foam    Heel Raises 20 reps   eccentric lowering   Other Standing Ankle Exercises power ups on foam pad x 15 each    Other Standing Ankle Exercises eccentric step down from 4 inch step x20 each, band sidestepping GTB 3RT, monster walk GTB 3RT      Ankle Exercises: Machines for Strengthening   Cybex Leg Press machine walkout 30# x 10                     PT Short Term Goals - 07/06/20 1005      PT SHORT TERM GOAL #1   Title Patient will be independent with initial HEP and self-management strategies to improve functional outcomes    Time 2    Period Weeks    Status New    Target Date 07/20/20             PT Long Term Goals - 07/06/20 1005      PT LONG TERM GOAL #1   Title Patient will report at least 80% overall improvement in subjective complaint to indicate improvement in ability to perform ADLs.    Time 4    Period Weeks    Status New    Target Date 08/03/20      PT LONG TERM GOAL #2   Title Patient will be able to maintain 60 seconds in single limb stance on compliant surface to demo improved ankle stability for increased functional mobility outdoors.  Time 4    Period Weeks    Status New    Target Date 08/03/20      PT LONG TERM GOAL #3   Title Patient will be able to walk >30 minutes with no increased ankle pain for improved ability to ambulate in community and outdoors    Time 4    Period Weeks    Status New    Target Date 08/03/20                 Plan - 07/27/20 1202    Clinical Impression Statement Patient progressing well toward therapy goals. Progressed ankle stabilization and proprioception with added power ups on foam. Added band monster walks and eccentric heel tap downs for functional LE strengthening. Patient educated on proper form and function of all added exercise. Patient will continue to benefit from skilled therapy services to reduce deficits and improve functional ability.    Personal Factors and Comorbidities Comorbidity 1    Comorbidities osteopenia    Examination-Activity Limitations Locomotion Level;Stand    Examination-Participation Restrictions Community Activity;Yard Work;Cleaning    Stability/Clinical Decision Making Stable/Uncomplicated    Rehab Potential Good    PT Frequency 2x / week    PT Duration 4 weeks    PT Treatment/Interventions ADLs/Self Care  Home Management;Aquatic Therapy;Biofeedback;Cryotherapy;Electrical Stimulation;Iontophoresis 4mg /ml Dexamethasone;Moist Heat;Traction;Balance training;Manual techniques;Manual lymph drainage;Therapeutic exercise;Vasopneumatic Device;Taping;Functional mobility training;Therapeutic activities;Orthotic Fit/Training;Energy conservation;Dry needling;Gait training;Stair training;DME Instruction;Patient/family education;Passive range of motion;Spinal Manipulations;Joint Manipulations;Visual/perceptual remediation/compensation;Compression bandaging;Scar mobilization;Neuromuscular re-education;Ultrasound;Parrafin;Fluidtherapy;Contrast Bath    PT Next Visit Plan Progress ankle strength and stability as tolerated. Manual STM as needed for pain and restrictions. Add SLS vectors, step ups on BOSU, squats    PT Home Exercise Plan Eval: ankle band 4 way, towel scrunch 4/7 windshield wipers, tandem stance 4/21 calf stretch at step, tandem on towel or pillow    Consulted and Agree with Plan of Care Patient           Patient will benefit from skilled therapeutic intervention in order to improve the following deficits and impairments:  Abnormal gait,Decreased range of motion,Improper body mechanics,Decreased balance,Difficulty walking,Pain,Decreased activity tolerance,Decreased strength,Increased fascial restricitons  Visit Diagnosis: Pain in left ankle and joints of left foot  Difficulty in walking, not elsewhere classified     Problem List Patient Active Problem List   Diagnosis Date Noted  . Soft tissue mass, left shoulder 01/01/2013   12:06 PM, 07/27/20 Josue Hector PT DPT  Physical Therapist with [facility]  [phone] [place] [address] [place], [place], [address] Phone: [phone]   Fax:  [phone]  Name: Brandi Davis MRN: [id number] Date of Birth: Oct 26, 1960

## 2020-07-29 ENCOUNTER — Ambulatory Visit (HOSPITAL_COMMUNITY): Payer: 59 | Admitting: Physical Therapy

## 2020-07-29 ENCOUNTER — Encounter (HOSPITAL_COMMUNITY): Payer: Self-pay | Admitting: Physical Therapy

## 2020-07-29 ENCOUNTER — Other Ambulatory Visit: Payer: Self-pay

## 2020-07-29 DIAGNOSIS — M25572 Pain in left ankle and joints of left foot: Secondary | ICD-10-CM | POA: Diagnosis not present

## 2020-07-29 DIAGNOSIS — R262 Difficulty in walking, not elsewhere classified: Secondary | ICD-10-CM

## 2020-07-29 NOTE — Patient Instructions (Signed)
Access Code: [email or web address] URL: [email or web address] Date: 07/29/2020 Prepared by: Josue Hector  Exercises Single Leg Stance with 3-Way Kick on Foam - 2 x daily - 7 x weekly - 1 sets - 5 reps - 10 seconds each hold Squat with Chair Touch and Resistance Loop - 2 x daily - 7 x weekly - 2 sets - 10 reps

## 2020-07-29 NOTE — Therapy (Signed)
[facility] [address] [place], [place], [address] Phone: [phone]   Fax:  [phone]  Physical Therapy Treatment  Patient Details  Name: Brandi Davis MRN: [id number] Date of Birth: Oct 18, 1960 Referring Provider (PT): Celesta Gentile DPM   Encounter Date: 07/29/2020   PT End of Session - 07/29/20 0828    Visit Number 7    Number of Visits 8    Date for PT Re-Evaluation 08/03/20    Authorization Type [employer] (30 VL, 0 used)    Authorization - Visit Number 7    Authorization - Number of Visits 30    PT Start Time 0820    PT Stop Time 0902    PT Time Calculation (min) 42 min    Activity Tolerance Patient tolerated treatment well    Behavior During Therapy [facility] for tasks assessed/performed           History reviewed. No pertinent past medical history.  Past Surgical History:  Procedure Laterality Date  . BREAST BIOPSY    . LAPAROSCOPY     x3    There were no vitals filed for this visit.   Subjective Assessment - 07/29/20 0826    Subjective Foot is doing well, better each day. Having some LT knee pain that started at her workout class this week.    Pertinent History Osteopenia    Limitations Standing;Walking;House hold activities    Patient Stated Goals Walk right again with no pain    Currently in Pain? Yes    Pain Score 3     Pain Location Knee    Pain Orientation Left;Medial    Pain Descriptors / Indicators Aching    Pain Type Acute pain    Pain Onset More than a month ago                             [facility] Adult PT Treatment/Exercise - 07/29/20 0001      Ankle Exercises: Aerobic   Recumbent Bike 5 min lv 3 dynamic warmup      Ankle Exercises: Stretches   Gastroc Stretch 3 reps;30 seconds      Ankle Exercises: Standing   Vector Stance 5 reps;10 seconds;Right;Left   on foam   Heel Raises 20 reps    Other Standing Ankle Exercises power ups on foam pad x 20 each, functional  squat with GTB at knees 2 x 10    Other Standing Ankle Exercises eccentric step down from 6 inch step x20 each, band sidestepping GTB 2RT, monster walk GTB 2RT                    PT Short Term Goals - 07/06/20 1005      PT SHORT TERM GOAL #1   Title Patient will be independent with initial HEP and self-management strategies to improve functional outcomes    Time 2    Period Weeks    Status New    Target Date 07/20/20             PT Long Term Goals - 07/06/20 1005      PT LONG TERM GOAL #1   Title Patient will report at least 80% overall improvement in subjective complaint to indicate improvement in ability to perform ADLs.    Time 4    Period Weeks    Status New    Target Date 08/03/20  PT LONG TERM GOAL #2   Title Patient will be able to maintain 60 seconds in single limb stance on compliant surface to demo improved ankle stability for increased functional mobility outdoors.    Time 4    Period Weeks    Status New    Target Date 08/03/20      PT LONG TERM GOAL #3   Title Patient will be able to walk >30 minutes with no increased ankle pain for improved ability to ambulate in community and outdoors    Time 4    Period Weeks    Status New    Target Date 08/03/20                 Plan - 07/29/20 1008    Clinical Impression Statement Patient tolerated session well today. Addressed complaint of knee pain. Noted tenderness to palpation about medial LT knee joint line. Educated patient this is likely to due muscle fatigue and level of intensity/ duration of her workout classes. Added squatting with band at knees for improved glute Medius activation and single leg stand vectors for knee and ankle stabilization. Patient was well challenged with stabilization exercise noting increased muscle fatigue but no increased knee pain. Overall progressing well toward therapy goals. Will reassess next week.    Personal Factors and Comorbidities Comorbidity 1     Comorbidities osteopenia    Examination-Activity Limitations Locomotion Level;Stand    Examination-Participation Restrictions Community Activity;Yard Work;Cleaning    Stability/Clinical Decision Making Stable/Uncomplicated    Rehab Potential Good    PT Frequency 2x / week    PT Duration 4 weeks    PT Treatment/Interventions ADLs/Self Care Home Management;Aquatic Therapy;Biofeedback;Cryotherapy;Electrical Stimulation;Iontophoresis 4mg /ml Dexamethasone;Moist Heat;Traction;Balance training;Manual techniques;Manual lymph drainage;Therapeutic exercise;Vasopneumatic Device;Taping;Functional mobility training;Therapeutic activities;Orthotic Fit/Training;Energy conservation;Dry needling;Gait training;Stair training;DME Instruction;Patient/family education;Passive range of motion;Spinal Manipulations;Joint Manipulations;Visual/perceptual remediation/compensation;Compression bandaging;Scar mobilization;Neuromuscular re-education;Ultrasound;Parrafin;Fluidtherapy;Contrast Bath    PT Next Visit Plan Progress ankle strength and stability as tolerated. Manual STM as needed for pain and restrictions. Add , step ups on BOSU    PT Home Exercise Plan Eval: ankle band 4 way, towel scrunch 4/7 windshield wipers, tandem stance 4/21 calf stretch at step, tandem on towel or pillow 4/28 band squats, single leg vectors    Consulted and Agree with Plan of Care Patient           Patient will benefit from skilled therapeutic intervention in order to improve the following deficits and impairments:  Abnormal gait,Decreased range of motion,Improper body mechanics,Decreased balance,Difficulty walking,Pain,Decreased activity tolerance,Decreased strength,Increased fascial restricitons  Visit Diagnosis: Pain in left ankle and joints of left foot  Difficulty in walking, not elsewhere classified     Problem List Patient Active Problem List   Diagnosis Date Noted  . Soft tissue mass, left shoulder 01/01/2013    10:10 AM,  07/29/20 Josue Hector PT DPT  Physical Therapist with [facility]  [phone] [place] [address] [place], [place], [address] Phone: [phone]   Fax:  [phone]  Name: Brandi Davis MRN: [id number] Date of Birth: 1961/03/28

## 2020-08-03 ENCOUNTER — Encounter (HOSPITAL_COMMUNITY): Payer: 59 | Admitting: Physical Therapy

## 2020-08-03 ENCOUNTER — Ambulatory Visit (HOSPITAL_COMMUNITY): Payer: 59 | Admitting: Physical Therapy

## 2020-08-04 ENCOUNTER — Ambulatory Visit (HOSPITAL_COMMUNITY): Payer: 59 | Attending: Podiatry | Admitting: Physical Therapy

## 2020-08-04 ENCOUNTER — Ambulatory Visit (HOSPITAL_COMMUNITY): Payer: 59 | Admitting: Physical Therapy

## 2020-08-04 ENCOUNTER — Encounter (HOSPITAL_COMMUNITY): Payer: Self-pay | Admitting: Physical Therapy

## 2020-08-04 ENCOUNTER — Other Ambulatory Visit: Payer: Self-pay

## 2020-08-04 DIAGNOSIS — R262 Difficulty in walking, not elsewhere classified: Secondary | ICD-10-CM | POA: Insufficient documentation

## 2020-08-04 DIAGNOSIS — M25572 Pain in left ankle and joints of left foot: Secondary | ICD-10-CM | POA: Insufficient documentation

## 2020-08-04 NOTE — Patient Instructions (Signed)
Access Code: [email or web address] URL: [email or web address] Date: 08/04/2020 Prepared by: Josue Hector  Exercises Side Stepping with Resistance at Feet - 1 x daily - 5 x weekly - 3 sets - 10 reps

## 2020-08-04 NOTE — Therapy (Signed)
[facility] [address] [place], [place], [address] Phone: [phone]   Fax:  [phone]  Physical Therapy Treatment  Patient Details  Name: KADIN BERA MRN: [id number] Date of Birth: October 05, 1960 Referring Provider (PT): Celesta Gentile DPM  Progress Note Reporting Period 07/06/20 to 08/04/20  See note below for Objective Data and Assessment of Progress/Goals.       Encounter Date: 08/04/2020   PT End of Session - 08/04/20 1602    Visit Number 8    Number of Visits 10    Date for PT Re-Evaluation 08/18/20    Authorization Type [employer] (30 VL, 0 used)    Authorization - Visit Number 8    Authorization - Number of Visits 30    PT Start Time 1155    PT Stop Time 1600    PT Time Calculation (min) 45 min    Activity Tolerance Patient tolerated treatment well    Behavior During Therapy [facility] for tasks assessed/performed           History reviewed. No pertinent past medical history.  Past Surgical History:  Procedure Laterality Date  . BREAST BIOPSY    . LAPAROSCOPY     x3    There were no vitals filed for this visit.   Subjective Assessment - 08/04/20 1520    Subjective Patient says she walked 5 miles a day for the past 3 days. She says she has been able to do her workout classes with no issues. Patient report 95% improvement since beginning therapy.    Pertinent History Osteopenia    Limitations Standing;Walking;House hold activities    Patient Stated Goals Walk right again with no pain    Currently in Pain? No/denies    Pain Onset More than a month ago              [facility] PT Assessment - 08/04/20 0001      Assessment   Medical Diagnosis LT ankle tendonitis    Referring Provider (PT) Celesta Gentile DPM    Prior Therapy No      Prior Function   Level of Independence Independent      AROM   Left Ankle Dorsiflexion 10    Left Ankle Plantar Flexion 50    Left Ankle Inversion 35    Left Ankle  Eversion 0      Strength   Left Ankle Dorsiflexion 5/5    Left Ankle Plantar Flexion 5/5    Left Ankle Inversion 5/5    Left Ankle Eversion 4+/5      Static Standing Balance   Static Standing Balance -  Activities  Single Leg Stance - Right Leg;Single Leg Stance - Left Leg    Static Standing - Comment/# of Minutes 60 seconds on foam, 18 seconds on foam                         [facility] Adult PT Treatment/Exercise - 08/04/20 0001      Ankle Exercises: Standing   Other Standing Ankle Exercises band sidestepping with band around feet GTB 3RT    Other Standing Ankle Exercises SLS on foam 3 x 60" (18 sec max on LT), SLS vectors solid floor 3 x 10" each on solid floor, tandem stance on foam 2 x 60" each                  PT Education - 08/04/20 1620    Education  Details on reassessment findings, progress toward therapy goals, POC and HEP    Person(s) Educated Patient    Methods Explanation;Handout    Comprehension Verbalized understanding            PT Short Term Goals - 08/04/20 1625      PT SHORT TERM GOAL #1   Title Patient will be independent with initial HEP and self-management strategies to improve functional outcomes    Baseline Reports compliance    Time 2    Period Weeks    Status Achieved    Target Date 07/20/20             PT Long Term Goals - 08/04/20 1625      PT LONG TERM GOAL #1   Title Patient will report at least 80% overall improvement in subjective complaint to indicate improvement in ability to perform ADLs.    Baseline Reports 95%    Time 4    Period Weeks    Status Achieved      PT LONG TERM GOAL #2   Title Patient will be able to maintain 60 seconds in single limb stance on compliant surface to demo improved ankle stability for increased functional mobility outdoors.    Baseline See balance    Time 4    Period Weeks    Status Partially Met      PT LONG TERM GOAL #3   Title Patient will be able to walk >30 minutes with  no increased ankle pain for improved ability to ambulate in community and outdoors    Time 4    Period Weeks    Status Achieved                 Plan - 08/04/20 1626    Clinical Impression Statement Patient showing good progress toward therapy goals. Patient shows improved functional mobility, functional strength and activity tolerance as well as decreased complaint of heel and ankle pain. Patient remains limited by mild ankle instability and balance deficits which continue to negatively impact functional ability. Patient will continue to benefit from skilled therapy services to address remaining deficits to reduce pain and improve functional ability.    Personal Factors and Comorbidities Comorbidity 1    Comorbidities osteopenia    Examination-Activity Limitations Locomotion Level;Stand    Examination-Participation Restrictions Community Activity;Yard Work;Cleaning    Stability/Clinical Decision Making Stable/Uncomplicated    Rehab Potential Good    PT Frequency 1x / week    PT Duration 2 weeks    PT Treatment/Interventions ADLs/Self Care Home Management;Aquatic Therapy;Biofeedback;Cryotherapy;Electrical Stimulation;Iontophoresis 63m/ml Dexamethasone;Moist Heat;Traction;Balance training;Manual techniques;Manual lymph drainage;Therapeutic exercise;Vasopneumatic Device;Taping;Functional mobility training;Therapeutic activities;Orthotic Fit/Training;Energy conservation;Dry needling;Gait training;Stair training;DME Instruction;Patient/family education;Passive range of motion;Spinal Manipulations;Joint Manipulations;Visual/perceptual remediation/compensation;Compression bandaging;Scar mobilization;Neuromuscular re-education;Ultrasound;Parrafin;Fluidtherapy;Contrast Bath    PT Next Visit Plan Progress ankle strength and stability as tolerated. Manual STM as needed for pain and restrictions. Add step ups on BOSU    PT Home Exercise Plan Eval: ankle band 4 way, towel scrunch 4/7 windshield wipers,  tandem stance 4/21 calf stretch at step, tandem on towel or pillow 4/28 band squats, single leg vectors    Consulted and Agree with Plan of Care Patient           Patient will benefit from skilled therapeutic intervention in order to improve the following deficits and impairments:  Abnormal gait,Decreased range of motion,Improper body mechanics,Decreased balance,Difficulty walking,Pain,Decreased activity tolerance,Decreased strength,Increased fascial restricitons  Visit Diagnosis: Pain in left ankle and joints of left foot  Difficulty in  walking, not elsewhere classified     Problem List Patient Active Problem List   Diagnosis Date Noted  . Soft tissue mass, left shoulder 01/01/2013   4:30 PM, 08/04/20 Josue Hector PT DPT  Physical Therapist with [facility]  [phone] [place] [address] [place], [place], [address] Phone: [phone]   Fax:  [phone]  Name: MARYKAY MCCLEOD MRN: [id number] Date of Birth: [birth year]

## 2020-08-05 ENCOUNTER — Ambulatory Visit (HOSPITAL_COMMUNITY): Payer: 59 | Admitting: Physical Therapy

## 2020-08-10 ENCOUNTER — Encounter (HOSPITAL_COMMUNITY): Payer: Self-pay | Admitting: Physical Therapy

## 2020-08-10 ENCOUNTER — Ambulatory Visit (HOSPITAL_COMMUNITY): Payer: 59 | Admitting: Physical Therapy

## 2020-08-10 ENCOUNTER — Other Ambulatory Visit: Payer: Self-pay

## 2020-08-10 DIAGNOSIS — M25572 Pain in left ankle and joints of left foot: Secondary | ICD-10-CM | POA: Diagnosis not present

## 2020-08-10 DIAGNOSIS — R262 Difficulty in walking, not elsewhere classified: Secondary | ICD-10-CM

## 2020-08-10 NOTE — Therapy (Signed)
[facility] [address] [place], [place], [address] Phone: [phone]   Fax:  [phone]  Physical Therapy Treatment  Patient Details  Name: Brandi Davis MRN: [id number] Date of Birth: 14-Jul-1960 Referring Provider (PT): Celesta Gentile DPM   Encounter Date: 08/10/2020   PT End of Session - 08/10/20 0951    Visit Number 9    Number of Visits 10    Date for PT Re-Evaluation 08/18/20    Authorization Type [employer] (30 VL, 0 used)    Authorization - Visit Number 9    Authorization - Number of Visits 30    PT Start Time [phone]    PT Stop Time 1028    PT Time Calculation (min) 42 min    Activity Tolerance Patient tolerated treatment well    Behavior During Therapy [facility] for tasks assessed/performed           History reviewed. No pertinent past medical history.  Past Surgical History:  Procedure Laterality Date  . BREAST BIOPSY    . LAPAROSCOPY     x3    There were no vitals filed for this visit.   Subjective Assessment - 08/10/20 0950    Subjective Patient says her ankle is sore and swollen. Not sure why. She reports no change in activity. She has new orthotics and new shoes today.    Pertinent History Osteopenia    Limitations Standing;Walking;House hold activities    Patient Stated Goals Walk right again with no pain    Currently in Pain? Yes    Pain Score 4     Pain Location Ankle    Pain Orientation Left    Pain Descriptors / Indicators Aching;Sore    Pain Onset More than a month ago    Pain Frequency Intermittent                             [facility] Adult PT Treatment/Exercise - 08/10/20 0001      Ankle Exercises: Aerobic   Recumbent Bike 4 min lv 3 dynamic warmup      Ankle Exercises: Stretches   Gastroc Stretch 3 reps;30 seconds      Ankle Exercises: Standing   BAPS Standing;Level 2;15 reps   DF/PF, INV, EV   SLS 3 x 30" on ground    Heel Raises 20 reps   from step   Other  Standing Ankle Exercises band sidestepping with band around feet GTB 3RT, functional squat 2 x 10    Other Standing Ankle Exercises lateral step up on 4 inch step LLE x 20      Ankle Exercises: Machines for Strengthening   Cybex Leg Press machine walkout 40# x 10, side ways 5 x 30# each                    PT Short Term Goals - 08/04/20 1625      PT SHORT TERM GOAL #1   Title Patient will be independent with initial HEP and self-management strategies to improve functional outcomes    Baseline Reports compliance    Time 2    Period Weeks    Status Achieved    Target Date 07/20/20             PT Long Term Goals - 08/04/20 1625      PT LONG TERM GOAL #1   Title Patient will report at least 80% overall improvement  in subjective complaint to indicate improvement in ability to perform ADLs.    Baseline Reports 95%    Time 4    Period Weeks    Status Achieved      PT LONG TERM GOAL #2   Title Patient will be able to maintain 60 seconds in single limb stance on compliant surface to demo improved ankle stability for increased functional mobility outdoors.    Baseline See balance    Time 4    Period Weeks    Status Partially Met      PT LONG TERM GOAL #3   Title Patient will be able to walk >30 minutes with no increased ankle pain for improved ability to ambulate in community and outdoors    Time 4    Period Weeks    Status Achieved                 Plan - 08/10/20 1026    Clinical Impression Statement Activity graded per patient tolerance today. Patient continues to be challenged with balance per decreased ankle stability. Focused on lateral and eccentric strengthening exercise today. Patient tolerated these well with minimal complaint. Patient showing improved squat form. Patient educated on pacing activity and adhering to specific rep ranges of exercise to avoid excess use of stabilizing ankle muscles.    Personal Factors and Comorbidities Comorbidity 1     Comorbidities osteopenia    Examination-Activity Limitations Locomotion Level;Stand    Examination-Participation Restrictions Community Activity;Yard Work;Cleaning    Stability/Clinical Decision Making Stable/Uncomplicated    Rehab Potential Good    PT Frequency 1x / week    PT Duration 2 weeks    PT Treatment/Interventions ADLs/Self Care Home Management;Aquatic Therapy;Biofeedback;Cryotherapy;Electrical Stimulation;Iontophoresis 15m/ml Dexamethasone;Moist Heat;Traction;Balance training;Manual techniques;Manual lymph drainage;Therapeutic exercise;Vasopneumatic Device;Taping;Functional mobility training;Therapeutic activities;Orthotic Fit/Training;Energy conservation;Dry needling;Gait training;Stair training;DME Instruction;Patient/family education;Passive range of motion;Spinal Manipulations;Joint Manipulations;Visual/perceptual remediation/compensation;Compression bandaging;Scar mobilization;Neuromuscular re-education;Ultrasound;Parrafin;Fluidtherapy;Contrast Bath    PT Next Visit Plan Progress ankle strength and stability as tolerated. Manual STM as needed for pain and restrictions. Add step ups on BOSU    PT Home Exercise Plan Eval: ankle band 4 way, towel scrunch 4/7 windshield wipers, tandem stance 4/21 calf stretch at step, tandem on towel or pillow 4/28 band squats, single leg vectors    Consulted and Agree with Plan of Care Patient           Patient will benefit from skilled therapeutic intervention in order to improve the following deficits and impairments:  Abnormal gait,Decreased range of motion,Improper body mechanics,Decreased balance,Difficulty walking,Pain,Decreased activity tolerance,Decreased strength,Increased fascial restricitons  Visit Diagnosis: Pain in left ankle and joints of left foot  Difficulty in walking, not elsewhere classified     Problem List Patient Active Problem List   Diagnosis Date Noted  . Soft tissue mass, left shoulder 01/01/2013   10:27 AM,  08/10/20 CJosue HectorPT DPT  [facility] [phone] [address] [address]Phone: [phone]  Fax:  [phone] Name: Brandi DIXMRN: [id number] of Birth: [birth year]

## 2020-08-17 ENCOUNTER — Other Ambulatory Visit: Payer: Self-pay

## 2020-08-17 ENCOUNTER — Encounter (HOSPITAL_COMMUNITY): Payer: Self-pay | Admitting: Physical Therapy

## 2020-08-17 ENCOUNTER — Ambulatory Visit (HOSPITAL_COMMUNITY): Payer: 59 | Admitting: Physical Therapy

## 2020-08-17 DIAGNOSIS — M25572 Pain in left ankle and joints of left foot: Secondary | ICD-10-CM

## 2020-08-17 DIAGNOSIS — R262 Difficulty in walking, not elsewhere classified: Secondary | ICD-10-CM

## 2020-08-17 NOTE — Therapy (Signed)
[facility] [address] [place], [place], [address] Phone: [phone]   Fax:  [phone]  Physical Therapy Treatment  Patient Details  Name: Brandi Davis MRN: [id number] Date of Birth: 1960-09-22 Referring Provider (PT): Celesta Gentile DPM   Progress Note Reporting Period 08/04/20 to 08/17/20  See note below for Objective Data and Assessment of Progress/Goals.       Encounter Date: 08/17/2020   PT End of Session - 08/17/20 0826    Visit Number 10    Number of Visits 12    Date for PT Re-Evaluation 09/01/20    Authorization Type [employer] (30 VL)    Authorization - Visit Number 10    Authorization - Number of Visits 30    PT Start Time 0820    PT Stop Time 0858    PT Time Calculation (min) 38 min    Activity Tolerance Patient tolerated treatment well    Behavior During Therapy [facility] for tasks assessed/performed           History reviewed. No pertinent past medical history.  Past Surgical History:  Procedure Laterality Date  . BREAST BIOPSY    . LAPAROSCOPY     x3    There were no vitals filed for this visit.   Subjective Assessment - 08/17/20 0825    Subjective Patient says her ankle is still sore. Swelling has gone down. She has walked less, but still attending workout class. Continues to wear ankle brace daily.    Pertinent History Osteopenia    Limitations Standing;Walking;House hold activities    Patient Stated Goals Walk right again with no pain    Currently in Pain? Yes    Pain Score 3     Pain Location Ankle    Pain Orientation Left    Pain Descriptors / Indicators Aching    Pain Type Acute pain    Pain Onset More than a month ago    Pain Frequency Intermittent              [facility] PT Assessment - 08/17/20 0001      Assessment   Medical Diagnosis LT ankle tendonitis    Referring Provider (PT) Celesta Gentile DPM      Static Standing Balance   Static Standing Balance -  Activities   Single Leg Stance - Right Leg;Single Leg Stance - Left Leg    Static Standing - Comment/# of Minutes 60 sec, 32 sec ( on foam)                         [facility] Adult PT Treatment/Exercise - 08/17/20 0001      Ankle Exercises: Aerobic   Recumbent Bike 4 min lv 3 dynamic warmup      Ankle Exercises: Stretches   Gastroc Stretch 2 reps;30 seconds      Ankle Exercises: Standing   Heel Raises 20 reps    Other Standing Ankle Exercises --    Other Standing Ankle Exercises FWD and Lateral step up on LLE x 20 4 inch step, 4 inch step down 4 inch step x20, power up on foam x 20, tandem stance on 1/2 roll 2 x 30"      Ankle Exercises: Seated   Other Seated Ankle Exercises BTB INV/ EV x 30 each                  PT Education - 08/17/20 0859    Education  Details on pacing activity, progress toward therapy goals and activity modification. Also on weaning form ankle stability brace with normal activity.    Person(s) Educated Patient    Methods Explanation    Comprehension Verbalized understanding            PT Short Term Goals - 08/04/20 1625      PT SHORT TERM GOAL #1   Title Patient will be independent with initial HEP and self-management strategies to improve functional outcomes    Baseline Reports compliance    Time 2    Period Weeks    Status Achieved    Target Date 07/20/20             PT Long Term Goals - 08/17/20 0857      PT LONG TERM GOAL #1   Title Patient will report at least 80% overall improvement in subjective complaint to indicate improvement in ability to perform ADLs.    Baseline Reports 95%    Time 4    Period Weeks    Status Achieved      PT LONG TERM GOAL #2   Title Patient will be able to maintain 60 seconds in single limb stance on compliant surface to demo improved ankle stability for increased functional mobility outdoors.    Baseline See balance    Time 4    Period Weeks    Status Partially Met      PT LONG TERM GOAL #3    Title Patient will be able to walk >30 minutes with no increased ankle pain for improved ability to ambulate in community and outdoors    Time 4    Period Weeks    Status Achieved                 Plan - 08/17/20 8338    Clinical Impression Statement Patient showing steady progress toward therapy goals and has currently met all goals except ankle stabilization/ balance on complaint surface. Patient continues to be limited by low level ankle pain and decreased ankle stability on uneven surfaces which continues to negatively impact function. Educated patient on holding on fitness class until ankle pain subsides, and continuing with therapy stretching and strengthening activity. Also discussed lessening time in stability brace with normal ADLs, walking on flat, level surface, as this is likely contributing to inhibited stabilization at LT ankle. Will plan to continue with established POC for 2 more weeks to address remaining deficits for decreased pain and improved functional mobility.    Personal Factors and Comorbidities Comorbidity 1    Comorbidities osteopenia    Examination-Activity Limitations Locomotion Level;Stand    Examination-Participation Restrictions Community Activity;Yard Work;Cleaning    Stability/Clinical Decision Making Stable/Uncomplicated    Rehab Potential Good    PT Frequency 1x / week    PT Duration 2 weeks    PT Treatment/Interventions ADLs/Self Care Home Management;Aquatic Therapy;Biofeedback;Cryotherapy;Electrical Stimulation;Iontophoresis 66m/ml Dexamethasone;Moist Heat;Traction;Balance training;Manual techniques;Manual lymph drainage;Therapeutic exercise;Vasopneumatic Device;Taping;Functional mobility training;Therapeutic activities;Orthotic Fit/Training;Energy conservation;Dry needling;Gait training;Stair training;DME Instruction;Patient/family education;Passive range of motion;Spinal Manipulations;Joint Manipulations;Visual/perceptual  remediation/compensation;Compression bandaging;Scar mobilization;Neuromuscular re-education;Ultrasound;Parrafin;Fluidtherapy;Contrast Bath    PT Next Visit Plan Progress ankle strength and stability as tolerated. Manual STM as needed for pain and restrictions. Add step ups on BOSU    PT Home Exercise Plan Eval: ankle band 4 way, towel scrunch 4/7 windshield wipers, tandem stance 4/21 calf stretch at step, tandem on towel or pillow 4/28 band squats, single leg vectors    Consulted and Agree with Plan of Care Patient  Patient will benefit from skilled therapeutic intervention in order to improve the following deficits and impairments:  Abnormal gait,Decreased range of motion,Improper body mechanics,Decreased balance,Difficulty walking,Pain,Decreased activity tolerance,Decreased strength,Increased fascial restricitons  Visit Diagnosis: Pain in left ankle and joints of left foot  Difficulty in walking, not elsewhere classified     Problem List Patient Active Problem List   Diagnosis Date Noted  . Soft tissue mass, left shoulder 01/01/2013   10:21 AM, 08/17/20 Josue Hector PT DPT  Physical Therapist with [facility]  [phone] [facility] [address] [place], [place], [address] Phone: [phone]   Fax:  [phone]  Name: Brandi Davis MRN: [id number] Date of Birth: 04-09-1960

## 2020-08-24 ENCOUNTER — Ambulatory Visit (HOSPITAL_COMMUNITY): Payer: 59 | Admitting: Physical Therapy

## 2020-08-24 ENCOUNTER — Other Ambulatory Visit: Payer: Self-pay

## 2020-08-24 DIAGNOSIS — M25572 Pain in left ankle and joints of left foot: Secondary | ICD-10-CM | POA: Diagnosis not present

## 2020-08-24 DIAGNOSIS — R262 Difficulty in walking, not elsewhere classified: Secondary | ICD-10-CM

## 2020-08-24 NOTE — Therapy (Signed)
[place], [place], [address] Phone: [phone]   Fax:  [phone]  Physical Therapy Treatment  Patient Details  Name: Brandi Davis MRN: [id number] Date of Birth: 09-12-1960 Referring Provider (PT): Celesta Gentile DPM   Encounter Date: 08/24/2020   PT End of Session - 08/24/20 1536    Visit Number 11    Number of Visits 12    Date for PT Re-Evaluation 09/01/20    Authorization Type [employer] (30 VL)    Authorization - Visit Number 11    Authorization - Number of Visits 30    PT Start Time 5697    PT Stop Time 1530    PT Time Calculation (min) 42 min    Activity Tolerance Patient tolerated treatment well    Behavior During Therapy [facility] for tasks assessed/performed           No past medical history on file.  Past Surgical History:  Procedure Laterality Date  . BREAST BIOPSY    . LAPAROSCOPY     x3    There were no vitals filed for this visit.   Subjective Assessment - 08/24/20 1451    Subjective PT states she stayed off her feet for 4 days.  STates the edema finally went down but still having pain of 3/10 in her foot (PF) and ankle.    Currently in Pain? Yes    Pain Score 3                              [facility] Adult PT Treatment/Exercise - 08/24/20 0001      Ankle Exercises: Aerobic   Elliptical 5 minutes forward      Ankle Exercises: Stretches   Plantar Fascia Stretch 3 reps;30 seconds    Slant Board Stretch 3 reps;30 seconds      Ankle Exercises: Standing   SLS 30" X2 on BOSU Lt only    Other Standing Ankle Exercises lunge onto BOSU no UE assist 10X each LE, squats 15 reps no UE on BOSU                    PT Short Term Goals - 08/04/20 1625      PT SHORT TERM GOAL #1   Title Patient will be independent with initial HEP and self-management strategies to improve functional outcomes    Baseline Reports compliance    Time 2    Period Weeks     Status Achieved    Target Date 07/20/20             PT Long Term Goals - 08/17/20 0857      PT LONG TERM GOAL #1   Title Patient will report at least 80% overall improvement in subjective complaint to indicate improvement in ability to perform ADLs.    Baseline Reports 95%    Time 4    Period Weeks    Status Achieved      PT LONG TERM GOAL #2   Title Patient will be able to maintain 60 seconds in single limb stance on compliant surface to demo improved ankle stability for increased functional mobility outdoors.    Baseline See balance    Time 4    Period Weeks    Status Partially Met      PT LONG TERM GOAL #3   Title Patient will be able to walk >30 minutes with  no increased ankle pain for improved ability to ambulate in community and outdoors    Time 4    Period Weeks    Status Achieved                 Plan - 08/24/20 1535    Clinical Impression Statement Began session today with ankle and foot stretches.  Increased challenge following with addition of BOSU to static balance activities.   Pt with max of 8" SLS hold on BOSU (tried on both sides with equal challenge), dome up BOSU for forward lunges and squats with dome side down.    Personal Factors and Comorbidities Comorbidity 1    Comorbidities osteopenia    Examination-Activity Limitations Locomotion Level;Stand    Examination-Participation Restrictions Community Activity;Yard Work;Cleaning    Stability/Clinical Decision Making Stable/Uncomplicated    Rehab Potential Good    PT Frequency 1x / week    PT Duration 2 weeks    PT Treatment/Interventions ADLs/Self Care Home Management;Aquatic Therapy;Biofeedback;Cryotherapy;Electrical Stimulation;Iontophoresis 33m/ml Dexamethasone;Moist Heat;Traction;Balance training;Manual techniques;Manual lymph drainage;Therapeutic exercise;Vasopneumatic Device;Taping;Functional mobility training;Therapeutic activities;Orthotic Fit/Training;Energy conservation;Dry needling;Gait  training;Stair training;DME Instruction;Patient/family education;Passive range of motion;Spinal Manipulations;Joint Manipulations;Visual/perceptual remediation/compensation;Compression bandaging;Scar mobilization;Neuromuscular re-education;Ultrasound;Parrafin;Fluidtherapy;Contrast [place]    PT Next Visit Plan Progress ankle strength and stability as tolerated. Manual STM as needed for pain and restrictions. Add step ups on BOSU    PT Home Exercise Plan Eval: ankle band 4 way, towel scrunch 4/7 windshield wipers, tandem stance 4/21 calf stretch at step, tandem on towel or pillow 4/28 band squats, single leg vectors    Consulted and Agree with Plan of Care Patient           Patient will benefit from skilled therapeutic intervention in order to improve the following deficits and impairments:  Abnormal gait,Decreased range of motion,Improper body mechanics,Decreased balance,Difficulty walking,Pain,Decreased activity tolerance,Decreased strength,Increased fascial restricitons  Visit Diagnosis: No diagnosis found.     Problem List Patient Active Problem List   Diagnosis Date Noted  . Soft tissue mass, left shoulder 01/01/2013   ATeena Irani PTA/CLT [phone] FTeena Irani5/24/2022, 3:37 PM  [place] [address]Phone: [phone]  Fax:  [phone] Name: Brandi FAIRBANKMRN: [id number] of Birth: 901-15-1962

## 2020-08-31 ENCOUNTER — Other Ambulatory Visit: Payer: Self-pay

## 2020-08-31 ENCOUNTER — Ambulatory Visit (HOSPITAL_COMMUNITY): Payer: 59 | Admitting: Physical Therapy

## 2020-08-31 ENCOUNTER — Encounter (HOSPITAL_COMMUNITY): Payer: Self-pay | Admitting: Physical Therapy

## 2020-08-31 DIAGNOSIS — M25572 Pain in left ankle and joints of left foot: Secondary | ICD-10-CM

## 2020-08-31 DIAGNOSIS — R262 Difficulty in walking, not elsewhere classified: Secondary | ICD-10-CM

## 2020-08-31 NOTE — Patient Instructions (Signed)
Access Code: [email or web address] URL: [email or web address] Date: 08/31/2020 Prepared by: Josue Hector  Exercises Side Stepping with Resistance at Ankles - 1 x daily - 7 x weekly - 2 sets - 5 reps Forward Monster Walks - 1 x daily - 7 x weekly - 2 sets - 5 reps Backward Monster Walks - 1 x daily - 7 x weekly - 2 sets - 5 reps Single Leg Balance on Foam Pad - 1 x daily - 7 x weekly - 1 sets - 3 reps - 60 seconds hold Prone Press Up - 2 x daily - 7 x weekly - 2 sets - 10 reps

## 2020-08-31 NOTE — Therapy (Signed)
[place] [address] [place], [place], [address] Phone: [phone]   Fax:  [phone]  Physical Therapy Treatment  Patient Details  Name: Brandi Davis MRN: [id number] Date of Birth: 09-08-1960 Referring Provider (PT): Celesta Gentile DPM  Progress Note Reporting Period 08/17/20 to 08/31/20  See note below for Objective Data and Assessment of Progress/Goals.       Encounter Date: 08/31/2020   PT End of Session - 08/31/20 1616    Visit Number 12    Number of Visits 16    Date for PT Re-Evaluation 09/29/20    Authorization Type [employer] (30 VL)    Authorization - Visit Number 12    Authorization - Number of Visits 30    PT Start Time 0405    PT Stop Time 0455    PT Time Calculation (min) 50 min    Activity Tolerance Patient tolerated treatment well    Behavior During Therapy [facility] for tasks assessed/performed           History reviewed. No pertinent past medical history.  Past Surgical History:  Procedure Laterality Date  . BREAST BIOPSY    . LAPAROSCOPY     x3    There were no vitals filed for this visit.   Subjective Assessment - 08/31/20 1612    Subjective Patient says she has not walked in 8 days, she has started going back to her fitness classes. Her pain is much better. Giving her ankle a rest was helpful.    Pertinent History Osteopenia    Limitations Standing;Walking;House hold activities    Currently in Pain? Yes    Pain Score 1     Pain Location Ankle    Pain Orientation Left    Pain Descriptors / Indicators Aching    Pain Type Acute pain    Pain Onset More than a month ago    Pain Frequency Intermittent    Aggravating Factors  walking, WB    Pain Relieving Factors bracing, NSAIDs, ice    Effect of Pain on Daily Activities Limits              [facility] PT Assessment - 08/31/20 0001      Assessment   Medical Diagnosis LT ankle tendonitis    Referring Provider (PT) Celesta Gentile DPM     Prior Therapy No      Home Environment   Living Environment Private residence      Prior Function   Level of Independence Independent      Cognition   Overall Cognitive Status Within Functional Limits for tasks assessed      Strength   Right Hip Extension 4/5    Right Hip ABduction 4/5    Left Hip Extension 4/5    Left Hip ABduction 4/5      Static Standing Balance   Static Standing Balance -  Activities  Single Leg Stance - Right Leg;Single Leg Stance - Left Leg   on foam   Static Standing - Comment/# of Minutes 60 seconds, 40 second                         [facility] Adult PT Treatment/Exercise - 08/31/20 0001      Ankle Exercises: Aerobic   Elliptical 5 minutes forward      Ankle Exercises: Stretches   Slant Board Stretch 3 reps;30 seconds      Ankle Exercises: Standing   Other Standing  Ankle Exercises FWD step up on 8 inch box x20, step down form 8 inch box x20, SLS on foam 3 rounds of 60" attempts      Ankle Exercises: Supine   Other Supine Ankle Exercises prone press ups x 15                    PT Short Term Goals - 08/04/20 1625      PT SHORT TERM GOAL #1   Title Patient will be independent with initial HEP and self-management strategies to improve functional outcomes    Baseline Reports compliance    Time 2    Period Weeks    Status Achieved    Target Date 07/20/20             PT Long Term Goals - 08/17/20 0857      PT LONG TERM GOAL #1   Title Patient will report at least 80% overall improvement in subjective complaint to indicate improvement in ability to perform ADLs.    Baseline Reports 95%    Time 4    Period Weeks    Status Achieved      PT LONG TERM GOAL #2   Title Patient will be able to maintain 60 seconds in single limb stance on compliant surface to demo improved ankle stability for increased functional mobility outdoors.    Baseline See balance    Time 4    Period Weeks    Status Partially Met      PT  LONG TERM GOAL #3   Title Patient will be able to walk >30 minutes with no increased ankle pain for improved ability to ambulate in community and outdoors    Time 4    Period Weeks    Status Achieved                 Plan - 08/31/20 1700    Clinical Impression Statement Patient continues to struggle with single limb stability. Her pain has improved but she is still limited by intermittent weakness and instability with LLE. There is still mild weakness in glute muscles but functional. Single limb activity is still the most difficult, there may be a lumbar component, it is difficult to discern. Described 2-3-week plan to return to PLOF so as to avoid reinjury/ inflammation of LT ankle tendons. Will continue POC with patient for this time to see this through.    Personal Factors and Comorbidities Comorbidity 1    Comorbidities osteopenia    Examination-Activity Limitations Locomotion Level;Stand    Examination-Participation Restrictions Community Activity;Yard Work;Cleaning    Stability/Clinical Decision Making Stable/Uncomplicated    Rehab Potential Good    PT Frequency 1x / week    PT Duration 4 weeks    PT Treatment/Interventions ADLs/Self Care Home Management;Aquatic Therapy;Biofeedback;Cryotherapy;Electrical Stimulation;Iontophoresis 28m/ml Dexamethasone;Moist Heat;Traction;Balance training;Manual techniques;Manual lymph drainage;Therapeutic exercise;Vasopneumatic Device;Taping;Functional mobility training;Therapeutic activities;Orthotic Fit/Training;Energy conservation;Dry needling;Gait training;Stair training;DME Instruction;Patient/family education;Passive range of motion;Spinal Manipulations;Joint Manipulations;Visual/perceptual remediation/compensation;Compression bandaging;Scar mobilization;Neuromuscular re-education;Ultrasound;Parrafin;Fluidtherapy;Contrast [place]    PT Next Visit Plan Assess response to updated HEP. Progress to step ups and balance on BOSU as tolerated (start walking  daily/ therapay exercise daily if no pain next session)    PT Home Exercise Plan Eval: ankle band 4 way, towel scrunch 4/7 windshield wipers, tandem stance 4/21 calf stretch at step, tandem on towel or pillow 4/28 band squats, single leg vectors, press ups, single leg balance on foam, band sidestep, monster walk    Consulted and Agree with Plan  of Care Patient           Patient will benefit from skilled therapeutic intervention in order to improve the following deficits and impairments:  Abnormal gait,Decreased range of motion,Improper body mechanics,Decreased balance,Difficulty walking,Pain,Decreased activity tolerance,Decreased strength,Increased fascial restricitons  Visit Diagnosis: Pain in left ankle and joints of left foot  Difficulty in walking, not elsewhere classified     Problem List Patient Active Problem List   Diagnosis Date Noted  . Soft tissue mass, left shoulder 01/01/2013    5:43 PM, 08/31/20 Josue Hector PT DPT  Physical Therapist with [facility]  [phone] [place] [address] [place], [place], [address] Phone: [phone]   Fax:  [phone]  Name: ALYNA STENSLAND MRN: [id number] Date of Birth: 04-13-1960

## 2020-09-07 ENCOUNTER — Other Ambulatory Visit: Payer: Self-pay

## 2020-09-07 ENCOUNTER — Encounter (HOSPITAL_COMMUNITY): Payer: 59 | Admitting: Physical Therapy

## 2020-09-07 ENCOUNTER — Ambulatory Visit (HOSPITAL_COMMUNITY): Payer: 59 | Attending: Podiatry | Admitting: Physical Therapy

## 2020-09-07 DIAGNOSIS — M25572 Pain in left ankle and joints of left foot: Secondary | ICD-10-CM | POA: Diagnosis not present

## 2020-09-07 DIAGNOSIS — R262 Difficulty in walking, not elsewhere classified: Secondary | ICD-10-CM

## 2020-09-07 NOTE — Therapy (Signed)
[place] [address] [place], [place], [address] Phone: [phone]   Fax:  [phone]  Physical Therapy Treatment  Patient Details  Name: BETSAIDA MISSOURI MRN: [id number] Date of Birth: 1961-02-11 Referring Provider (PT): Celesta Gentile DPM   Encounter Date: 09/07/2020   PT End of Session - 09/07/20 1049    Visit Number 13    Number of Visits 16    Date for PT Re-Evaluation 09/29/20    Authorization Type [employer] (30 VL)    Authorization - Visit Number 13    Authorization - Number of Visits 30    PT Start Time [phone]    PT Stop Time 1000    PT Time Calculation (min) 42 min    Activity Tolerance Patient tolerated treatment well    Behavior During Therapy [facility] for tasks assessed/performed           No past medical history on file.  Past Surgical History:  Procedure Laterality Date  . BREAST BIOPSY    . LAPAROSCOPY     x3    There were no vitals filed for this visit.   Subjective Assessment - 09/07/20 1018    Subjective Pt states she is much better and without pain today.  doing her classes and walking some now without difficulty.    Currently in Pain? No/denies                             [facility] Adult PT Treatment/Exercise - 09/07/20 0001      Ankle Exercises: Standing   Vector Stance Right;Left;5 seconds;Limitations   10 reps intermittent HHA   Other Standing Ankle Exercises FWD step up on 8 inch box x20, 8" step down, 8" lateral step      Ankle Exercises: Stretches   Slant Board Stretch 3 reps;30 seconds      Ankle Exercises: Aerobic   Elliptical 5 minutes forward      Ankle Exercises: Seated   Other Seated Ankle Exercises sit to stand with  Lt back 2X10 then Rt back 2X10                    PT Short Term Goals - 08/04/20 1625      PT SHORT TERM GOAL #1   Title Patient will be independent with initial HEP and self-management strategies to improve functional outcomes     Baseline Reports compliance    Time 2    Period Weeks    Status Achieved    Target Date 07/20/20             PT Long Term Goals - 08/17/20 0857      PT LONG TERM GOAL #1   Title Patient will report at least 80% overall improvement in subjective complaint to indicate improvement in ability to perform ADLs.    Baseline Reports 95%    Time 4    Period Weeks    Status Achieved      PT LONG TERM GOAL #2   Title Patient will be able to maintain 60 seconds in single limb stance on compliant surface to demo improved ankle stability for increased functional mobility outdoors.    Baseline See balance    Time 4    Period Weeks    Status Partially Met      PT LONG TERM GOAL #3   Title Patient will be able to walk >30 minutes  with no increased ankle pain for improved ability to ambulate in community and outdoors    Time 4    Period Weeks    Status Achieved                 Plan - 09/07/20 1036    Clinical Impression Statement continued with focus on improving bilateral hip strength to improve stability and balance.  pt given vectors for HEP and encouraged to increase her hold times/decrease her UE assist.  pt continues to need UE with this as unable to maintain SLS for full time.   Added sit to stand with staggered LE to isolate glute with activity.  Pt able to complete these with noted challenge.  No issues at end of session.    Personal Factors and Comorbidities Comorbidity 1    Comorbidities osteopenia    Examination-Activity Limitations Locomotion Level;Stand    Examination-Participation Restrictions Community Activity;Yard Work;Cleaning    Stability/Clinical Decision Making Stable/Uncomplicated    Rehab Potential Good    PT Frequency 1x / week    PT Duration 4 weeks    PT Treatment/Interventions ADLs/Self Care Home Management;Aquatic Therapy;Biofeedback;Cryotherapy;Electrical Stimulation;Iontophoresis 33m/ml Dexamethasone;Moist Heat;Traction;Balance training;Manual  techniques;Manual lymph drainage;Therapeutic exercise;Vasopneumatic Device;Taping;Functional mobility training;Therapeutic activities;Orthotic Fit/Training;Energy conservation;Dry needling;Gait training;Stair training;DME Instruction;Patient/family education;Passive range of motion;Spinal Manipulations;Joint Manipulations;Visual/perceptual remediation/compensation;Compression bandaging;Scar mobilization;Neuromuscular re-education;Ultrasound;Parrafin;Fluidtherapy;Contrast [place]    PT Next Visit Plan Progress to activies on BOSU (squats, lunges, etc) and isolation of glute mm.    PT Home Exercise Plan Eval: ankle band 4 way, towel scrunch 4/7 windshield wipers, tandem stance 4/21 calf stretch at step, tandem on towel or pillow 4/28 band squats, single leg vectors, press ups, single leg balance on foam, band sidestep, monster walk  6/7: vectors    Consulted and Agree with Plan of Care Patient           Patient will benefit from skilled therapeutic intervention in order to improve the following deficits and impairments:  Abnormal gait,Decreased range of motion,Improper body mechanics,Decreased balance,Difficulty walking,Pain,Decreased activity tolerance,Decreased strength,Increased fascial restricitons  Visit Diagnosis: Pain in left ankle and joints of left foot  Difficulty in walking, not elsewhere classified     Problem List Patient Active Problem List   Diagnosis Date Noted  . Soft tissue mass, left shoulder 01/01/2013   ATeena Irani PTA/CLT [phone] FTeena Irani6/10/2020, 10:49 AM  [place] [address]Phone: [phone]  Fax:  [phone] Name: NFRANK PILGERMRN: [id number] of Birth: 9Nov 29, 1962

## 2020-09-14 ENCOUNTER — Encounter (HOSPITAL_COMMUNITY): Payer: Self-pay | Admitting: Physical Therapy

## 2020-09-14 ENCOUNTER — Other Ambulatory Visit: Payer: Self-pay

## 2020-09-14 ENCOUNTER — Ambulatory Visit (HOSPITAL_COMMUNITY): Payer: 59 | Admitting: Physical Therapy

## 2020-09-14 DIAGNOSIS — R262 Difficulty in walking, not elsewhere classified: Secondary | ICD-10-CM

## 2020-09-14 DIAGNOSIS — M25572 Pain in left ankle and joints of left foot: Secondary | ICD-10-CM

## 2020-09-14 NOTE — Therapy (Signed)
[place], [place], [address] Phone: [phone]   Fax:  [phone]  Physical Therapy Treatment  Patient Details  Name: Brandi Davis MRN: [id number] Date of Birth: 04-22-1960 Referring Provider (PT): Celesta Gentile DPM   Encounter Date: 09/14/2020   PT End of Session - 09/14/20 1615     Visit Number 14    Number of Visits 16    Date for PT Re-Evaluation 09/29/20    Authorization Type [employer] (30 VL)    Authorization - Visit Number 14    Authorization - Number of Visits 30    PT Start Time 1520    PT Stop Time 1600    PT Time Calculation (min) 40 min    Activity Tolerance Patient tolerated treatment well    Behavior During Therapy [facility] for tasks assessed/performed             History reviewed. No pertinent past medical history.  Past Surgical History:  Procedure Laterality Date   BREAST BIOPSY     LAPAROSCOPY     x3    There were no vitals filed for this visit.   Subjective Assessment - 09/14/20 1521     Subjective Patient says she is doing well. She has been walking. Still having variable foot pain, that she describes as moving. Reports compliance with HEP.    Pertinent History Osteopenia    Limitations Standing;Walking;House hold activities    Currently in Pain? Yes    Pain Score 2     Pain Location Heel    Pain Orientation Left    Pain Descriptors / Indicators Throbbing    Pain Type Acute pain    Pain Onset More than a month ago    Pain Frequency Intermittent                               [facility] Adult PT Treatment/Exercise - 09/14/20 0001       Exercises   Exercises Knee/Hip      Knee/Hip Exercises: Standing   Heel Raises 20 reps    Heel Raises Limitations incline slope    Lateral Step Up Both;1 set;15 reps    Lateral Step Up Limitations BOSU    Forward Step Up Both;1 set;15 reps    Forward Step Up Limitations BOSU    Functional Squat 15 reps     Functional Squat Limitations BOSU    Other Standing Knee Exercises tandem on 1/2 foam 2 x 30"    Other Standing Knee Exercises single leg stand on foam 2 x 60", single leg on foam with GTB perturbations 3 x 20 reps each leg      Ankle Exercises: Stretches   Slant Board Stretch 3 reps;30 seconds      Ankle Exercises: Aerobic   Elliptical 5 minutes forward                      PT Short Term Goals - 08/04/20 1625       PT SHORT TERM GOAL #1   Title Patient will be independent with initial HEP and self-management strategies to improve functional outcomes    Baseline Reports compliance    Time 2    Period Weeks    Status Achieved    Target Date 07/20/20               PT Long Term Goals -  08/17/20 0857       PT LONG TERM GOAL #1   Title Patient will report at least 80% overall improvement in subjective complaint to indicate improvement in ability to perform ADLs.    Baseline Reports 95%    Time 4    Period Weeks    Status Achieved      PT LONG TERM GOAL #2   Title Patient will be able to maintain 60 seconds in single limb stance on compliant surface to demo improved ankle stability for increased functional mobility outdoors.    Baseline See balance    Time 4    Period Weeks    Status Partially Met      PT LONG TERM GOAL #3   Title Patient will be able to walk >30 minutes with no increased ankle pain for improved ability to ambulate in community and outdoors    Time 4    Period Weeks    Status Achieved                   Plan - 09/14/20 1617     Clinical Impression Statement Patient tolerated session well with no increased complaint of pain. Patient showing improved stability and was able to hold single leg stance on foam for 1 minute bilateral. Added single leg stands with perturbations. Discussed changing walk cadence to shorten stride length with daily walks as this seems to be the primary contributor of pain symptoms currently patient  reports. Will assess response next visit.    Personal Factors and Comorbidities Comorbidity 1    Comorbidities osteopenia    Examination-Activity Limitations Locomotion Level;Stand    Examination-Participation Restrictions Community Activity;Yard Work;Cleaning    Stability/Clinical Decision Making Stable/Uncomplicated    Rehab Potential Good    PT Frequency 1x / week    PT Duration 4 weeks    PT Treatment/Interventions ADLs/Self Care Home Management;Aquatic Therapy;Biofeedback;Cryotherapy;Electrical Stimulation;Iontophoresis 48m/ml Dexamethasone;Moist Heat;Traction;Balance training;Manual techniques;Manual lymph drainage;Therapeutic exercise;Vasopneumatic Device;Taping;Functional mobility training;Therapeutic activities;Orthotic Fit/Training;Energy conservation;Dry needling;Gait training;Stair training;DME Instruction;Patient/family education;Passive range of motion;Spinal Manipulations;Joint Manipulations;Visual/perceptual remediation/compensation;Compression bandaging;Scar mobilization;Neuromuscular re-education;Ultrasound;Parrafin;Fluidtherapy;Contrast [place]    PT Next Visit Plan Reassess, transition to HEP when ready.    PT Home Exercise Plan Eval: ankle band 4 way, towel scrunch 4/7 windshield wipers, tandem stance 4/21 calf stretch at step, tandem on towel or pillow 4/28 band squats, single leg vectors, press ups, single leg balance on foam, band sidestep, monster walk  6/7: vectors    Consulted and Agree with Plan of Care Patient             Patient will benefit from skilled therapeutic intervention in order to improve the following deficits and impairments:  Abnormal gait, Decreased range of motion, Improper body mechanics, Decreased balance, Difficulty walking, Pain, Decreased activity tolerance, Decreased strength, Increased fascial restricitons  Visit Diagnosis: Pain in left ankle and joints of left foot  Difficulty in walking, not elsewhere classified     Problem  List Patient Active Problem List   Diagnosis Date Noted   Soft tissue mass, left shoulder 01/01/2013   4:22 PM, 09/14/20 CJosue HectorPT DPT  [facility] [phone] [address] [address]Phone: [phone]  Fax:  [phone] Name: Brandi SAMONTEMRN: [id number] of Birth: 915-Jun-1962

## 2020-09-21 ENCOUNTER — Encounter (HOSPITAL_COMMUNITY): Payer: 59 | Admitting: Physical Therapy

## 2020-09-22 ENCOUNTER — Other Ambulatory Visit: Payer: Self-pay

## 2020-09-22 ENCOUNTER — Ambulatory Visit (HOSPITAL_COMMUNITY): Payer: 59 | Admitting: Physical Therapy

## 2020-09-22 ENCOUNTER — Encounter (HOSPITAL_COMMUNITY): Payer: Self-pay | Admitting: Physical Therapy

## 2020-09-22 DIAGNOSIS — M25572 Pain in left ankle and joints of left foot: Secondary | ICD-10-CM | POA: Diagnosis not present

## 2020-09-22 DIAGNOSIS — R262 Difficulty in walking, not elsewhere classified: Secondary | ICD-10-CM

## 2020-09-22 NOTE — Patient Instructions (Signed)
Access Code: [email or web address] URL: [email or web address] Date: 09/22/2020 Prepared by: Josue Hector  Exercises Left Standing Lateral Shift Correction at [place] - 1 x daily - 7 x weekly - 1-2 sets - 10 reps Soleus Stretch on Wall - 2-3 x daily - 7 x weekly - 1 sets - 3 reps - 30 second hold

## 2020-09-22 NOTE — Therapy (Signed)
[facility] [address] [place], [place], [address] Phone: [phone]   Fax:  [phone]  Physical Therapy Treatment  Patient Details  Name: Brandi Davis MRN: [id number] Date of Birth: 12/25/1960 Referring Provider (PT): Celesta Gentile DPM   Encounter Date: 09/22/2020   PT End of Session - 09/22/20 2706     Visit Number 15    Number of Visits 16    Date for PT Re-Evaluation 09/29/20    Authorization Type [employer] (30 VL)    Authorization - Visit Number 15    Authorization - Number of Visits 30    PT Start Time 0818    PT Stop Time 0900    PT Time Calculation (min) 42 min    Activity Tolerance Patient tolerated treatment well    Behavior During Therapy [facility] for tasks assessed/performed             History reviewed. No pertinent past medical history.  Past Surgical History:  Procedure Laterality Date   BREAST BIOPSY     LAPAROSCOPY     x3    There were no vitals filed for this visit.   Subjective Assessment - 09/22/20 0822     Subjective Ankle still sore after walking. Feels that it is the walking that is causing remaining discomfort.    Pertinent History Osteopenia    Limitations Standing;Walking;House hold activities    Currently in Pain? Yes    Pain Score 1     Pain Location Heel    Pain Orientation Left    Pain Descriptors / Indicators Burning    Pain Type Acute pain    Pain Onset More than a month ago    Pain Frequency Intermittent                               [facility] Adult PT Treatment/Exercise - 09/22/20 0001       Knee/Hip Exercises: Stretches   Soleus Stretch Left;2 reps;30 seconds      Knee/Hip Exercises: Aerobic   Elliptical 4 min dynamic warmup    Tread Mill 2% inclinc 5 min work up to 3.74mh      Knee/Hip Exercises: Standing   Other Standing Knee Exercises LT lateral sideglides      Knee/Hip Exercises: Seated   Other Seated Knee/Hip Exercises soleus  stretch 2 x 30"      Manual Therapy   Manual Therapy Soft tissue mobilization    Manual therapy comments completed separate form all other activity    Soft tissue mobilization STM to LT posterior tibialis                      PT Short Term Goals - 08/04/20 1625       PT SHORT TERM GOAL #1   Title Patient will be independent with initial HEP and self-management strategies to improve functional outcomes    Baseline Reports compliance    Time 2    Period Weeks    Status Achieved    Target Date 07/20/20               PT Long Term Goals - 08/17/20 0857       PT LONG TERM GOAL #1   Title Patient will report at least 80% overall improvement in subjective complaint to indicate improvement in ability to perform ADLs.    Baseline Reports 95%  Time 4    Period Weeks    Status Achieved      PT LONG TERM GOAL #2   Title Patient will be able to maintain 60 seconds in single limb stance on compliant surface to demo improved ankle stability for increased functional mobility outdoors.    Baseline See balance    Time 4    Period Weeks    Status Partially Met      PT LONG TERM GOAL #3   Title Patient will be able to walk >30 minutes with no increased ankle pain for improved ability to ambulate in community and outdoors    Time 4    Period Weeks    Status Achieved                   Plan - 09/22/20 0859     Clinical Impression Statement Performed gait assessment and noticed slight lateral shift of trunk to LT. Also noted upon palpation increased tenderness on LT posterior tibialis. Educated patient on lateral side glides and solus stretching. Added to HEP. Will assess response at next visit.    Personal Factors and Comorbidities Comorbidity 1    Comorbidities osteopenia    Examination-Activity Limitations Locomotion Level;Stand    Examination-Participation Restrictions Community Activity;Yard Work;Cleaning    Stability/Clinical Decision Making  Stable/Uncomplicated    Rehab Potential Good    PT Frequency 1x / week    PT Duration 4 weeks    PT Treatment/Interventions ADLs/Self Care Home Management;Aquatic Therapy;Biofeedback;Cryotherapy;Electrical Stimulation;Iontophoresis 68m/ml Dexamethasone;Moist Heat;Traction;Balance training;Manual techniques;Manual lymph drainage;Therapeutic exercise;Vasopneumatic Device;Taping;Functional mobility training;Therapeutic activities;Orthotic Fit/Training;Energy conservation;Dry needling;Gait training;Stair training;DME Instruction;Patient/family education;Passive range of motion;Spinal Manipulations;Joint Manipulations;Visual/perceptual remediation/compensation;Compression bandaging;Scar mobilization;Neuromuscular re-education;Ultrasound;Parrafin;Fluidtherapy;Contrast Bath    PT Next Visit Plan Assess response to updated HEP.    PT Home Exercise Plan Eval: ankle band 4 way, towel scrunch 4/7 windshield wipers, tandem stance 4/21 calf stretch at step, tandem on towel or pillow 4/28 band squats, single leg vectors, press ups, single leg balance on foam, band sidestep, monster walk  6/7: vectors 6/22 LT side glides, solues stretch    Consulted and Agree with Plan of Care Patient             Patient will benefit from skilled therapeutic intervention in order to improve the following deficits and impairments:  Abnormal gait, Decreased range of motion, Improper body mechanics, Decreased balance, Difficulty walking, Pain, Decreased activity tolerance, Decreased strength, Increased fascial restricitons  Visit Diagnosis: Pain in left ankle and joints of left foot  Difficulty in walking, not elsewhere classified     Problem List Patient Active Problem List   Diagnosis Date Noted   Soft tissue mass, left shoulder 01/01/2013   9:03 AM, 09/22/20 CJosue HectorPT DPT  [facility] [phone] [address] [address]Phone: [phone]  Fax:  [phone] Name: NLADELL BEYMRN: [id number] of Birth: 905/21/1962

## 2020-09-27 ENCOUNTER — Encounter (HOSPITAL_COMMUNITY): Payer: Self-pay | Admitting: Physical Therapy

## 2020-09-27 ENCOUNTER — Ambulatory Visit (HOSPITAL_COMMUNITY): Payer: 59 | Admitting: Physical Therapy

## 2020-09-27 ENCOUNTER — Other Ambulatory Visit: Payer: Self-pay

## 2020-09-27 DIAGNOSIS — M25572 Pain in left ankle and joints of left foot: Secondary | ICD-10-CM | POA: Diagnosis not present

## 2020-09-27 DIAGNOSIS — R262 Difficulty in walking, not elsewhere classified: Secondary | ICD-10-CM

## 2020-09-27 NOTE — Therapy (Signed)
[facility] [address] [place], [place], [address] Phone: [phone]   Fax:  [phone]  Physical Therapy Treatment  Patient Details  Name: Brandi Davis MRN: [id number] Date of Birth: Oct 20, 1960 Referring Provider (PT): Celesta Gentile DPM  PHYSICAL THERAPY DISCHARGE SUMMARY  Visits from Start of Care: 16  Current functional level related to goals / functional outcomes: See below    Remaining deficits: See below   Education / Equipment: See assessment    Patient agrees to discharge. Patient goals were met. Patient is being discharged due to meeting the stated rehab goals.  Encounter Date: 09/27/2020   PT End of Session - 09/27/20 1037     Visit Number 16    Number of Visits 16    Date for PT Re-Evaluation 09/29/20    Authorization Type [employer] (30 VL)    Authorization - Visit Number 16    Authorization - Number of Visits 30    PT Start Time 1034    PT Stop Time 1108    PT Time Calculation (min) 34 min    Activity Tolerance Patient tolerated treatment well    Behavior During Therapy [facility] for tasks assessed/performed             History reviewed. No pertinent past medical history.  Past Surgical History:  Procedure Laterality Date   BREAST BIOPSY     LAPAROSCOPY     x3    There were no vitals filed for this visit.   Subjective Assessment - 09/27/20 1036     Subjective Able to resume all prior activity. " A little sore".    Pertinent History Osteopenia    Limitations Standing;Walking;House hold activities    Currently in Pain? No/denies    Pain Onset More than a month ago                [facility] PT Assessment - 09/27/20 0001       Assessment   Medical Diagnosis LT ankle tendonitis    Referring Provider (PT) Celesta Gentile DPM    Prior Therapy No      [place] residence      Prior Function   Level of Independence Independent      Cognition    Overall Cognitive Status Within Functional Limits for tasks assessed      Strength   Right Hip Extension 4/5    Right Hip ABduction 4+/5   was 4   Left Hip Extension 4/5    Left Hip ABduction 4+/5   was 4     Ambulation/Gait   Stairs Yes    Stairs Assistance 7: Independent    Stair Management Technique Alternating pattern    Number of Stairs 16    Height of Stairs 7      Static Standing Balance   Static Standing Balance -  Activities  Single Leg Stance - Right Leg;Single Leg Stance - Left Leg    Static Standing - Comment/# of Minutes >60 sec bilaterally                           [facility] Adult PT Treatment/Exercise - 09/27/20 0001       Knee/Hip Exercises: Aerobic   Elliptical 4 min dynamic warmup    Tread Mill 3% incline 6 min @ 54mh  PT Short Term Goals - 08/04/20 1625       PT SHORT TERM GOAL #1   Title Patient will be independent with initial HEP and self-management strategies to improve functional outcomes    Baseline Reports compliance    Time 2    Period Weeks    Status Achieved    Target Date 07/20/20               PT Long Term Goals - 09/27/20 1116       PT LONG TERM GOAL #1   Title Patient will report at least 80% overall improvement in subjective complaint to indicate improvement in ability to perform ADLs.    Baseline Reports 95%    Time 4    Period Weeks    Status Achieved      PT LONG TERM GOAL #2   Title Patient will be able to maintain 60 seconds in single limb stance on compliant surface to demo improved ankle stability for increased functional mobility outdoors.    Baseline See balance    Time 4    Period Weeks    Status Achieved      PT LONG TERM GOAL #3   Title Patient will be able to walk >30 minutes with no increased ankle pain for improved ability to ambulate in community and outdoors    Time 4    Period Weeks    Status Achieved                   Plan - 09/27/20  1111     Clinical Impression Statement Patient has made very good progress toward therapy goals and is currently with all goals met. Patient able to walk on incline for sustained period with good mechanics and no increased complaint of pain. Patient showing significant improvement in ankle stabilization with single leg balance on compliant surface. Reviewed goals and HEP. Answered all patient questions. Patient being DC today with all goals met. Patient unencouraged to follow up with therapy services with any further questions or concerns.    Personal Factors and Comorbidities Comorbidity 1    Comorbidities osteopenia    Examination-Activity Limitations Locomotion Level;Stand    Examination-Participation Restrictions Community Activity;Yard Work;Cleaning    Stability/Clinical Decision Making Stable/Uncomplicated    Rehab Potential Good    PT Treatment/Interventions ADLs/Self Care Home Management;Aquatic Therapy;Biofeedback;Cryotherapy;Electrical Stimulation;Iontophoresis 62m/ml Dexamethasone;Moist Heat;Traction;Balance training;Manual techniques;Manual lymph drainage;Therapeutic exercise;Vasopneumatic Device;Taping;Functional mobility training;Therapeutic activities;Orthotic Fit/Training;Energy conservation;Dry needling;Gait training;Stair training;DME Instruction;Patient/family education;Passive range of motion;Spinal Manipulations;Joint Manipulations;Visual/perceptual remediation/compensation;Compression bandaging;Scar mobilization;Neuromuscular re-education;Ultrasound;Parrafin;Fluidtherapy;Contrast Bath    PT Next Visit Plan DC to HEP    PT Home Exercise Plan Eval: ankle band 4 way, towel scrunch 4/7 windshield wipers, tandem stance 4/21 calf stretch at step, tandem on towel or pillow 4/28 band squats, single leg vectors, press ups, single leg balance on foam, band sidestep, monster walk  6/7: vectors 6/22 LT side glides, solues stretch    Consulted and Agree with Plan of Care Patient              Patient will benefit from skilled therapeutic intervention in order to improve the following deficits and impairments:  Abnormal gait, Decreased range of motion, Improper body mechanics, Decreased balance, Difficulty walking, Pain, Decreased activity tolerance, Decreased strength, Increased fascial restricitons  Visit Diagnosis: Pain in left ankle and joints of left foot  Difficulty in walking, not elsewhere classified     Problem List Patient Active Problem List   Diagnosis Date Noted  Soft tissue mass, left shoulder 01/01/2013   11:17 AM, 09/27/20 Josue Hector PT DPT  Physical Therapist with [facility]  [phone] [facility] [address] [place], [place], [address] Phone: [phone]   Fax:  [phone]  Name: SHONDRA CAPPS MRN: [id number] Date of Birth: 09/13/1960

## 2020-12-01 ENCOUNTER — Encounter (HOSPITAL_COMMUNITY): Payer: Self-pay | Admitting: Physical Therapy

## 2020-12-01 ENCOUNTER — Ambulatory Visit (HOSPITAL_COMMUNITY): Payer: 59 | Attending: Podiatry | Admitting: Physical Therapy

## 2020-12-01 ENCOUNTER — Other Ambulatory Visit: Payer: Self-pay

## 2020-12-01 DIAGNOSIS — M25572 Pain in left ankle and joints of left foot: Secondary | ICD-10-CM | POA: Insufficient documentation

## 2020-12-01 DIAGNOSIS — R262 Difficulty in walking, not elsewhere classified: Secondary | ICD-10-CM | POA: Diagnosis present

## 2020-12-01 NOTE — Therapy (Signed)
[facility] [address] [place], [place], [address] Phone: [phone]   Fax:  [phone]  Physical Therapy Evaluation  Patient Details  Name: Brandi Davis MRN: [id number] Date of Birth: [birth year] Referring Provider (PT): Earnestine Leys MD   Encounter Date: 12/01/2020   PT End of Session - 12/01/20 0916     Visit Number 1    Number of Visits 8    Date for PT Re-Evaluation 12/29/20    Authorization Type [employer]    PT Start Time 0901    PT Stop Time [id number]    PT Time Calculation (min) 42 min    Activity Tolerance Patient tolerated treatment well    Behavior During Therapy [facility] for tasks assessed/performed             History reviewed. No pertinent past medical history.  Past Surgical History:  Procedure Laterality Date   BREAST BIOPSY     LAPAROSCOPY     x3    There were no vitals filed for this visit.    Subjective Assessment - 12/01/20 0914     Subjective Patient presents to therapy with complaint of LT ankle pain. Patient is previously known to this clinic. Patient states she was doing better but eventually pain returned. She says she switched MDs and got diagnosis from Dr. Sabra Heck of posterior tibial tendonitis. She is still walking almost daily, doing yoga and going to exercise class 2 x weekly. She recently started meloxicam which she says has been helping, but bothers her stomach sometimes. She has adjusted dose and this is working better. She would like to continue working ankle strengthening for tendons in combination with NSAIDs to reduce pain and improve overall function.    Pertinent History Osteopenia    Limitations Standing;Walking;House hold activities    Patient Stated Goals Be able to wear heels an boots, be able to do several activities in same day without ankle pain    Currently in Pain? No/denies                [facility] PT Assessment - 12/01/20 0001       Assessment   Medical Diagnosis  LT posterior tibial tendonitis    Referring Provider (PT) Earnestine Leys MD    Prior Therapy Yes      Home Environment   Living Environment Private residence      Prior Function   Level of Independence Independent      Cognition   Overall Cognitive Status Within Functional Limits for tasks assessed      Observation/Other Assessments   Focus on Therapeutic Outcomes (FOTO)  78% function      AROM   Left Ankle Dorsiflexion 8    Left Ankle Plantar Flexion 53    Left Ankle Inversion --   WNL   Left Ankle Eversion --   WNL     Strength   Right Hip Flexion 5/5    Right Hip Extension 4/5    Right Hip ABduction 4+/5    Left Hip Flexion 5/5    Left Hip Extension 4+/5    Left Hip ABduction 4-/5    Right Knee Extension 5/5    Left Knee Extension 5/5    Left Ankle Dorsiflexion 5/5    Left Ankle Plantar Flexion 4+/5    Left Ankle Inversion 4+/5    Left Ankle Eversion 4/5      Palpation   Palpation comment min/mod TTP about LT posterior tibialis muscle  belly                        Objective measurements completed on examination: See above findings.               PT Education - 12/01/20 0915     Education Details on evaluation findings, POC and HEP    Person(s) Educated Patient    Methods Explanation;Handout    Comprehension Verbalized understanding              PT Short Term Goals - 12/01/20 0948       PT SHORT TERM GOAL #1   Title Patient will be independent with initial HEP and self-management strategies to improve functional outcomes    Time 2    Period Weeks    Status New    Target Date 12/15/20               PT Long Term Goals - 12/01/20 1006       PT LONG TERM GOAL #1   Title Patient will report at least 85% overall improvement in subjective complaint to indicate improvement in ability to perform ADLs.    Time 4    Period Weeks    Status New    Target Date 12/29/20      PT LONG TERM GOAL #2   Title Patient will be  able to maintain 60 seconds in single limb stance on compliant surface to demo improved ankle stability for increased functional mobility outdoors.    Time 4    Period Weeks    Status New    Target Date 12/29/20      PT LONG TERM GOAL #3   Title Patient will be able to go on daily walks >30 minutes with no increased ankle pain for restored functional mobility and QOL    Time 4    Period Weeks    Status New    Target Date 12/29/20      PT LONG TERM GOAL #4   Title Patient will be independent with advanced HEP and self-management strategies to improve functional outcomes    Time 4    Period Weeks    Status New    Target Date 12/29/20                    Plan - 12/01/20 0946     Clinical Impression Statement Patient is a 60 y.o. female who presents to physical therapy with complaint of LT ankle pain. Patient demonstrates decreased strength, ROM restriction, and increased tenderness to palpation/ fascial restrictions which are likely contributing to symptoms of pain and are negatively impacting patient ability to perform ADLs and functional mobility tasks. Patient will benefit from skilled physical therapy services to address these deficits to reduce pain, improve level of function with ADLs and functional mobility tasks.    Personal Factors and Comorbidities Comorbidity 1    Comorbidities osteopenia    Examination-Activity Limitations Locomotion Level;Stand;Stairs    Examination-Participation Restrictions Community Activity;Yard Work;Cleaning    Stability/Clinical Decision Making Stable/Uncomplicated    Clinical Decision Making Low    Rehab Potential Good    PT Frequency 2x / week    PT Duration 4 weeks    PT Treatment/Interventions ADLs/Self Care Home Management;Aquatic Therapy;Biofeedback;Cryotherapy;Electrical Stimulation;Iontophoresis '4mg'$ /ml Dexamethasone;Moist Heat;Traction;Balance training;Manual techniques;Manual lymph drainage;Therapeutic exercise;Vasopneumatic  Device;Taping;Functional mobility training;Therapeutic activities;Orthotic Fit/Training;Energy conservation;Dry needling;Gait training;Stair training;DME Instruction;Patient/family education;Passive range of motion;Spinal Manipulations;Joint Manipulations;Visual/perceptual remediation/compensation;Compression bandaging;Scar mobilization;Neuromuscular re-education;Ultrasound;Parrafin;Fluidtherapy;Contrast [employer]  PT Next Visit Plan Review goals and HEP. Progress isometric ankle strength, emphasis on satbilization. Manual STM to posterior tibialis, possibly dry needling. Assess gait with prolonged walking/ variable speeds    PT Home Exercise Plan Eval: heel raise with isometric 5" holds    Consulted and Agree with Plan of Care Patient             Patient will benefit from skilled therapeutic intervention in order to improve the following deficits and impairments:  Abnormal gait, Decreased range of motion, Improper body mechanics, Decreased balance, Difficulty walking, Pain, Decreased activity tolerance, Decreased strength, Increased fascial restricitons  Visit Diagnosis: Pain in left ankle and joints of left foot  Difficulty in walking, not elsewhere classified     Problem List Patient Active Problem List   Diagnosis Date Noted   Soft tissue mass, left shoulder 01/01/2013   10:16 AM, 12/01/20 Josue Hector PT DPT  Physical Therapist with [facility]  [phone] [place], [place], [address] Phone: [phone]   Fax:  [phone]  Name: Brandi Davis MRN: [id number] Date of Birth: 08/11/1960

## 2020-12-01 NOTE — Patient Instructions (Signed)

## 2020-12-02 ENCOUNTER — Ambulatory Visit (HOSPITAL_COMMUNITY): Payer: 59 | Attending: Podiatry | Admitting: Physical Therapy

## 2020-12-02 DIAGNOSIS — R262 Difficulty in walking, not elsewhere classified: Secondary | ICD-10-CM | POA: Insufficient documentation

## 2020-12-02 DIAGNOSIS — M25572 Pain in left ankle and joints of left foot: Secondary | ICD-10-CM | POA: Insufficient documentation

## 2020-12-02 NOTE — Therapy (Signed)
[facility] [address] [place], [place], [address] Phone: [phone]   Fax:  [phone]  Physical Therapy Treatment  Patient Details  Name: Brandi Davis MRN: [id number] Date of Birth: 01-25-1961 Referring Provider (PT): Earnestine Leys MD   Encounter Date: 12/02/2020   PT End of Session - 12/02/20 0912     Visit Number 2    Number of Visits 8    Date for PT Re-Evaluation 12/29/20    Authorization Type [employer]    PT Start Time [phone]    PT Stop Time 0945    PT Time Calculation (min) 40 min    Activity Tolerance Patient tolerated treatment well    Behavior During Therapy [facility] for tasks assessed/performed             No past medical history on file.  Past Surgical History:  Procedure Laterality Date   BREAST BIOPSY     LAPAROSCOPY     x3    There were no vitals filed for this visit.   Subjective Assessment - 12/02/20 0911     Subjective Doing well with HEP, no issues. Went for walk yesterday, noting pain at lateral ankle about 1 mile in. Was sore this AM, but feels better with exercise and stretching.    Pertinent History Osteopenia    Limitations Standing;Walking;House hold activities    Patient Stated Goals Be able to wear heels an boots, be able to do several activities in same day without ankle pain    Currently in Pain? No/denies                               [facility] Adult PT Treatment/Exercise - 12/02/20 0001       Knee/Hip Exercises: Stretches   Gastroc Stretch Both;4 reps;30 seconds    Gastroc Stretch Limitations slant board      Knee/Hip Exercises: Aerobic   Elliptical 10 min ramping elevation to 5%, speed ramp to 3.1 mph      Knee/Hip Exercises: Standing   Heel Raises 15 reps    Heel Raises Limitations 5 sec iso hold              Trigger Point Dry Needling - 12/02/20 0001     Consent Given? Yes    Education Handout Provided Yes    Muscles Treated Lower Quadrant  Posterior tibialis    Dry Needling Comments Tolerated well, 1 needle x 2 mid and lower tibialis posterior muscle belly LT    Posterior tibialis Response Palpable increased muscle length                    PT Short Term Goals - 12/01/20 0948       PT SHORT TERM GOAL #1   Title Patient will be independent with initial HEP and self-management strategies to improve functional outcomes    Time 2    Period Weeks    Status New    Target Date 12/15/20               PT Long Term Goals - 12/01/20 1006       PT LONG TERM GOAL #1   Title Patient will report at least 85% overall improvement in subjective complaint to indicate improvement in ability to perform ADLs.    Time 4    Period Weeks    Status New    Target Date 12/29/20  PT LONG TERM GOAL #2   Title Patient will be able to maintain 60 seconds in single limb stance on compliant surface to demo improved ankle stability for increased functional mobility outdoors.    Time 4    Period Weeks    Status New    Target Date 12/29/20      PT LONG TERM GOAL #3   Title Patient will be able to go on daily walks >30 minutes with no increased ankle pain for restored functional mobility and QOL    Time 4    Period Weeks    Status New    Target Date 12/29/20      PT LONG TERM GOAL #4   Title Patient will be independent with advanced HEP and self-management strategies to improve functional outcomes    Time 4    Period Weeks    Status New    Target Date 12/29/20                   Plan - 12/02/20 1147     Clinical Impression Statement Patient tolerated session well today. Reviewed goals and HEP. Patient demos good return. Assessed gait with ambulation on treadmill at increasing speed and incline. Patient did well with this, did note very slight "twinge" of discomfort at LT ankle around 8-minute mark with 5% incline. Resolved quickly with rest. Performed trigger point dry needling to LT posterior tibialis to  target palpable trigger points. Patient tolerated this well. Some improvement in palpable restriction but no twitch response noted. Will assess response and continue to progress functional activity as tolerated.    Personal Factors and Comorbidities Comorbidity 1    Comorbidities osteopenia    Examination-Activity Limitations Locomotion Level;Stand;Stairs    Examination-Participation Restrictions Community Activity;Yard Work;Cleaning    Stability/Clinical Decision Making Stable/Uncomplicated    Rehab Potential Good    PT Frequency 2x / week    PT Duration 4 weeks    PT Treatment/Interventions ADLs/Self Care Home Management;Aquatic Therapy;Biofeedback;Cryotherapy;Electrical Stimulation;Iontophoresis '4mg'$ /ml Dexamethasone;Moist Heat;Traction;Balance training;Manual techniques;Manual lymph drainage;Therapeutic exercise;Vasopneumatic Device;Taping;Functional mobility training;Therapeutic activities;Orthotic Fit/Training;Energy conservation;Dry needling;Gait training;Stair training;DME Instruction;Patient/family education;Passive range of motion;Spinal Manipulations;Joint Manipulations;Visual/perceptual remediation/compensation;Compression bandaging;Scar mobilization;Neuromuscular re-education;Ultrasound;Parrafin;Fluidtherapy;Contrast Bath    PT Next Visit Plan Progress isometric ankle strength, try single leg heel raise with hold, or eccentrics.  Emphasis on stabilization. Balance on foam, BAPs. Manual STM to posterior tibialis, assess reponse to dry needling. Continue gait with prolonged walking/ variable speeds    PT Home Exercise Plan Eval: heel raise with isometric 5" holds    Consulted and Agree with Plan of Care Patient             Patient will benefit from skilled therapeutic intervention in order to improve the following deficits and impairments:  Abnormal gait, Decreased range of motion, Improper body mechanics, Decreased balance, Difficulty walking, Pain, Decreased activity tolerance,  Decreased strength, Increased fascial restricitons  Visit Diagnosis: Pain in left ankle and joints of left foot  Difficulty in walking, not elsewhere classified     Problem List Patient Active Problem List   Diagnosis Date Noted   Soft tissue mass, left shoulder 01/01/2013   11:51 AM, 12/02/20 Josue Hector PT DPT  Physical Therapist with [facility]  [phone] [place], [place], [address] Phone: [phone]   Fax:  [phone]  Name: Brandi Davis MRN: [id number] Date of Birth: 1960-11-06

## 2020-12-07 ENCOUNTER — Encounter (HOSPITAL_COMMUNITY): Payer: Self-pay | Admitting: Physical Therapy

## 2020-12-07 ENCOUNTER — Ambulatory Visit (HOSPITAL_COMMUNITY): Payer: 59 | Admitting: Physical Therapy

## 2020-12-07 ENCOUNTER — Other Ambulatory Visit: Payer: Self-pay

## 2020-12-07 DIAGNOSIS — R262 Difficulty in walking, not elsewhere classified: Secondary | ICD-10-CM

## 2020-12-07 DIAGNOSIS — M25572 Pain in left ankle and joints of left foot: Secondary | ICD-10-CM

## 2020-12-07 NOTE — Therapy (Signed)
[facility] [address] [place], [place], [address] Phone: [phone]   Fax:  [phone]  Physical Therapy Treatment  Patient Details  Name: Brandi Davis MRN: [id number] Date of Birth: September 15, 1960 Referring Provider (PT): Earnestine Leys MD   Encounter Date: 12/07/2020   PT End of Session - 12/07/20 1529     Visit Number 3    Number of Visits 8    Date for PT Re-Evaluation 12/29/20    Authorization Type [employer]    PT Start Time 1520    PT Stop Time 1605    PT Time Calculation (min) 45 min    Activity Tolerance Patient tolerated treatment well    Behavior During Therapy [facility] for tasks assessed/performed             History reviewed. No pertinent past medical history.  Past Surgical History:  Procedure Laterality Date   BREAST BIOPSY     LAPAROSCOPY     x3    There were no vitals filed for this visit.   Subjective Assessment - 12/07/20 1523     Subjective Doing well today. Feels dry needling helped reduce size of knotting in ankle muscles. Feels performance and function has been about the same. Still having low level pain with increased activity. No pain currently.    Pertinent History Osteopenia    Limitations Standing;Walking;House hold activities    Patient Stated Goals Be able to wear heels an boots, be able to do several activities in same day without ankle pain    Currently in Pain? No/denies                               [facility] Adult PT Treatment/Exercise - 12/07/20 0001       Knee/Hip Exercises: Aerobic   Elliptical 10 min ramping elevation to 5%, speed ramp to 3.1 mph      Knee/Hip Exercises: Machines for Strengthening   Other Machine FWD and lateral walkouts on machine 30# x 10 each      Knee/Hip Exercises: Standing   Heel Raises 20 reps    Heel Raises Limitations 5 sec iso hold    Other Standing Knee Exercises tandem on 1/2 foam 2 x 30"    Other Standing Knee Exercises  rocker board holds lateral 5 x 10",      Manual Therapy   Manual Therapy Taping    Kinesiotex Facilitate Muscle                      PT Short Term Goals - 12/01/20 0948       PT SHORT TERM GOAL #1   Title Patient will be independent with initial HEP and self-management strategies to improve functional outcomes    Time 2    Period Weeks    Status New    Target Date 12/15/20               PT Long Term Goals - 12/01/20 1006       PT LONG TERM GOAL #1   Title Patient will report at least 85% overall improvement in subjective complaint to indicate improvement in ability to perform ADLs.    Time 4    Period Weeks    Status New    Target Date 12/29/20      PT LONG TERM GOAL #2   Title Patient will be able to maintain 60  seconds in single limb stance on compliant surface to demo improved ankle stability for increased functional mobility outdoors.    Time 4    Period Weeks    Status New    Target Date 12/29/20      PT LONG TERM GOAL #3   Title Patient will be able to go on daily walks >30 minutes with no increased ankle pain for restored functional mobility and QOL    Time 4    Period Weeks    Status New    Target Date 12/29/20      PT LONG TERM GOAL #4   Title Patient will be independent with advanced HEP and self-management strategies to improve functional outcomes    Time 4    Period Weeks    Status New    Target Date 12/29/20                   Plan - 12/07/20 1629     Clinical Impression Statement Patient tolerated session well today with no increased complaint of pain, but does not occasional "twinge". Progressed stabilization activity. Patient well challenged with added lateral rocker board holds. Continues to demo dropped navicular arch on LT with various function activity. Trialed Ktape for arch support with functional activity. Will assess response at next session.    Personal Factors and Comorbidities Comorbidity 1    Comorbidities  osteopenia    Examination-Activity Limitations Locomotion Level;Stand;Stairs    Examination-Participation Restrictions Community Activity;Yard Work;Cleaning    Stability/Clinical Decision Making Stable/Uncomplicated    Rehab Potential Good    PT Frequency 2x / week    PT Duration 4 weeks    PT Treatment/Interventions ADLs/Self Care Home Management;Aquatic Therapy;Biofeedback;Cryotherapy;Electrical Stimulation;Iontophoresis '4mg'$ /ml Dexamethasone;Moist Heat;Traction;Balance training;Manual techniques;Manual lymph drainage;Therapeutic exercise;Vasopneumatic Device;Taping;Functional mobility training;Therapeutic activities;Orthotic Fit/Training;Energy conservation;Dry needling;Gait training;Stair training;DME Instruction;Patient/family education;Passive range of motion;Spinal Manipulations;Joint Manipulations;Visual/perceptual remediation/compensation;Compression bandaging;Scar mobilization;Neuromuscular re-education;Ultrasound;Parrafin;Fluidtherapy;Contrast Bath    PT Next Visit Plan Progress isometric ankle strength, try single leg heel raise with hold, or eccentrics.  Emphasis on stabilization. Balance on foam, BAPs. Manual STM to posterior tibialis, assess reponse to k taping. Continue gait with prolonged walking/ variable speeds. Begin plyometrics if tolerated    PT Home Exercise Plan Eval: heel raise with isometric 5" holds 9/6 try single leg heel raise    Consulted and Agree with Plan of Care Patient             Patient will benefit from skilled therapeutic intervention in order to improve the following deficits and impairments:  Abnormal gait, Decreased range of motion, Improper body mechanics, Decreased balance, Difficulty walking, Pain, Decreased activity tolerance, Decreased strength, Increased fascial restricitons  Visit Diagnosis: Pain in left ankle and joints of left foot  Difficulty in walking, not elsewhere classified     Problem List Patient Active Problem List   Diagnosis  Date Noted   Soft tissue mass, left shoulder 01/01/2013   4:33 PM, 12/07/20 Josue Hector PT DPT  Physical Therapist with [facility]  [phone] [place], [place], [address] Phone: [phone]   Fax:  [phone]  Name: Brandi Davis MRN: [id number] Date of Birth: 09-21-1960

## 2020-12-09 ENCOUNTER — Ambulatory Visit (HOSPITAL_COMMUNITY): Payer: 59 | Admitting: Physical Therapy

## 2020-12-09 ENCOUNTER — Other Ambulatory Visit: Payer: Self-pay

## 2020-12-09 ENCOUNTER — Encounter (HOSPITAL_COMMUNITY): Payer: Self-pay | Admitting: Physical Therapy

## 2020-12-09 DIAGNOSIS — M25572 Pain in left ankle and joints of left foot: Secondary | ICD-10-CM

## 2020-12-09 DIAGNOSIS — R262 Difficulty in walking, not elsewhere classified: Secondary | ICD-10-CM

## 2020-12-09 NOTE — Therapy (Signed)
[facility] [address] [place], [place], [address] Phone: [phone]   Fax:  [phone]  Physical Therapy Treatment  Patient Details  Name: Brandi Davis MRN: [id number] Date of Birth: Nov 27, 1960 Referring Provider (PT): Earnestine Leys MD   Encounter Date: 12/09/2020   PT End of Session - 12/09/20 1309     Visit Number 4    Number of Visits 8    Date for PT Re-Evaluation 12/29/20    Authorization Type [employer]    PT Start Time [id number]    PT Stop Time 1346    PT Time Calculation (min) 41 min    Activity Tolerance Patient tolerated treatment well    Behavior During Therapy [facility] for tasks assessed/performed             History reviewed. No pertinent past medical history.  Past Surgical History:  Procedure Laterality Date   BREAST BIOPSY     LAPAROSCOPY     x3    There were no vitals filed for this visit.   Subjective Assessment - 12/09/20 1308     Subjective Feels tape was helpful. Feels more stable. Didn't get to go walking because of rain, but felt steadier with power flex class. No pain currently.    Pertinent History Osteopenia    Limitations Standing;Walking;House hold activities    Patient Stated Goals Be able to wear heels an boots, be able to do several activities in same day without ankle pain    Currently in Pain? No/denies                               [facility] Adult PT Treatment/Exercise - 12/09/20 0001       Knee/Hip Exercises: Aerobic   Elliptical 10 min ramping elevation to 5%, speed ramp to 3.1 mph      Knee/Hip Exercises: Machines for Strengthening   Other Machine FWD and lateral walkouts on machine 30# x 10 each      Knee/Hip Exercises: Standing   Heel Raises 20 reps    Heel Raises Limitations 5 sec iso hold    SLS 3 x 30" on foam    Other Standing Knee Exercises FWD/ LAt step hops x 10 each    Other Standing Knee Exercises rocker board holds lateral 5 x 15",                        PT Short Term Goals - 12/01/20 0948       PT SHORT TERM GOAL #1   Title Patient will be independent with initial HEP and self-management strategies to improve functional outcomes    Time 2    Period Weeks    Status New    Target Date 12/15/20               PT Long Term Goals - 12/01/20 1006       PT LONG TERM GOAL #1   Title Patient will report at least 85% overall improvement in subjective complaint to indicate improvement in ability to perform ADLs.    Time 4    Period Weeks    Status New    Target Date 12/29/20      PT LONG TERM GOAL #2   Title Patient will be able to maintain 60 seconds in single limb stance on compliant surface to demo improved ankle stability for increased functional mobility  outdoors.    Time 4    Period Weeks    Status New    Target Date 12/29/20      PT LONG TERM GOAL #3   Title Patient will be able to go on daily walks >30 minutes with no increased ankle pain for restored functional mobility and QOL    Time 4    Period Weeks    Status New    Target Date 12/29/20      PT LONG TERM GOAL #4   Title Patient will be independent with advanced HEP and self-management strategies to improve functional outcomes    Time 4    Period Weeks    Status New    Target Date 12/29/20                   Plan - 12/09/20 1346     Clinical Impression Statement Patient tolerated session well today. Added plyometric progressions. Patient well challenged with this but improved with verbal cues and practice. Patient reports no increased pain during session. Improved hold times with single limb stance on foam. Will continue to progress as tolerated.    Personal Factors and Comorbidities Comorbidity 1    Comorbidities osteopenia    Examination-Activity Limitations Locomotion Level;Stand;Stairs    Examination-Participation Restrictions Community Activity;Yard Work;Cleaning    Stability/Clinical Decision Making  Stable/Uncomplicated    Rehab Potential Good    PT Frequency 2x / week    PT Duration 4 weeks    PT Treatment/Interventions ADLs/Self Care Home Management;Aquatic Therapy;Biofeedback;Cryotherapy;Electrical Stimulation;Iontophoresis '4mg'$ /ml Dexamethasone;Moist Heat;Traction;Balance training;Manual techniques;Manual lymph drainage;Therapeutic exercise;Vasopneumatic Device;Taping;Functional mobility training;Therapeutic activities;Orthotic Fit/Training;Energy conservation;Dry needling;Gait training;Stair training;DME Instruction;Patient/family education;Passive range of motion;Spinal Manipulations;Joint Manipulations;Visual/perceptual remediation/compensation;Compression bandaging;Scar mobilization;Neuromuscular re-education;Ultrasound;Parrafin;Fluidtherapy;Contrast Bath    PT Next Visit Plan Progress isometric ankle strength, try single leg heel raise with hold, or eccentrics.  Emphasis on stabilization. Balance on foam, BAPs. Manual STM to posterior tibialis, assess reponse to k taping. Continue gait with prolonged walking/ variable speeds. Continue with plyometric progressions    PT Home Exercise Plan Eval: heel raise with isometric 5" holds 9/6 try single leg heel raise 9/8 fwd/ lat step hops    Consulted and Agree with Plan of Care Patient             Patient will benefit from skilled therapeutic intervention in order to improve the following deficits and impairments:  Abnormal gait, Decreased range of motion, Improper body mechanics, Decreased balance, Difficulty walking, Pain, Decreased activity tolerance, Decreased strength, Increased fascial restricitons  Visit Diagnosis: Pain in left ankle and joints of left foot  Difficulty in walking, not elsewhere classified     Problem List Patient Active Problem List   Diagnosis Date Noted   Soft tissue mass, left shoulder 01/01/2013   1:49 PM, 12/09/20 Josue Hector PT DPT  Physical Therapist with [facility]   [phone] [place], [place], [address] Phone: [phone]   Fax:  [phone]  Name: Brandi Davis MRN: [id number] Date of Birth: 03-23-1961

## 2020-12-14 ENCOUNTER — Ambulatory Visit (HOSPITAL_COMMUNITY): Payer: 59 | Admitting: Physical Therapy

## 2020-12-14 ENCOUNTER — Encounter (HOSPITAL_COMMUNITY): Payer: Self-pay | Admitting: Physical Therapy

## 2020-12-14 ENCOUNTER — Other Ambulatory Visit: Payer: Self-pay

## 2020-12-14 DIAGNOSIS — R262 Difficulty in walking, not elsewhere classified: Secondary | ICD-10-CM

## 2020-12-14 DIAGNOSIS — M25572 Pain in left ankle and joints of left foot: Secondary | ICD-10-CM | POA: Diagnosis not present

## 2020-12-15 NOTE — Therapy (Signed)
[facility] [address] [place], [place], [address] Phone: [phone]   Fax:  [phone]  Physical Therapy Treatment  Patient Details  Name: Brandi Davis MRN: [id number] Date of Birth: 07/03/1960 Referring Provider (PT): Earnestine Leys MD   Encounter Date: 12/14/2020    History reviewed. No pertinent past medical history.  Past Surgical History:  Procedure Laterality Date   BREAST BIOPSY     LAPAROSCOPY     x3    12/14/20 0823  Symptoms/Limitations  Subjective Doing alright. Thinks things are improving. Still has low level pain in ankle occasionally. Describes as worse this morning but better once she got up and went for a walk.  Pertinent History Osteopenia  Limitations Standing;Walking;House hold activities  Patient Stated Goals Be able to wear heels an boots, be able to do several activities in same day without ankle pain  Pain Assessment  Currently in Pain? Yes  Pain Score 2  Pain Location Ankle  Pain Orientation Left  Pain Descriptors / Indicators Sore   There were no vitals filed for this visit.   12/14/20 0001  Knee/Hip Exercises: Aerobic  Tread Mill 10 min ramping elevation to 8%, speed ramp to 3.38mh  Knee/Hip Exercises: Machines for Strengthening  Other Machine FWD and lateral walkouts on machine 30# x 5 each  Knee/Hip Exercises: Plyometrics  Other Plyometric Exercises 6 inch step depth jumps, focus on shock absortion and foot mechnics x10  Knee/Hip Exercises: Standing  Heel Raises 20 reps  Heel Raises Limitations 5 sec iso hold  Other Standing Knee Exercises FWD/ LAt step hops x 10 each  Other Standing Knee Exercises rocker board holds lateral 5 x 15",  SLS with Vectors 3 x 10" holds 3 way on foam      PT Short Term Goals - 12/01/20 0948       PT SHORT TERM GOAL #1   Title Patient will be independent with initial HEP and self-management strategies to improve functional outcomes    Time 2    Period  Weeks    Status New    Target Date 12/15/20               PT Long Term Goals - 12/01/20 1006       PT LONG TERM GOAL #1   Title Patient will report at least 85% overall improvement in subjective complaint to indicate improvement in ability to perform ADLs.    Time 4    Period Weeks    Status New    Target Date 12/29/20      PT LONG TERM GOAL #2   Title Patient will be able to maintain 60 seconds in single limb stance on compliant surface to demo improved ankle stability for increased functional mobility outdoors.    Time 4    Period Weeks    Status New    Target Date 12/29/20      PT LONG TERM GOAL #3   Title Patient will be able to go on daily walks >30 minutes with no increased ankle pain for restored functional mobility and QOL    Time 4    Period Weeks    Status New    Target Date 12/29/20      PT LONG TERM GOAL #4   Title Patient will be independent with advanced HEP and self-management strategies to improve functional outcomes    Time 4    Period Weeks    Status New  Target Date 12/29/20                12/14/20 0903  Plan  Clinical Impression Statement Patient tolerated ther ex progressions well and was well challnged with plyometric progressions. Patient noting improved pain symptoms end of session. Patient required increased verbal cues and demo for improved shcok absortion with landing phase of dpeth jumps. Patient will conitnue to beneift form skilled thepray servcies to reeduce deficits and improve funcitonal ability.  Personal Factors and Comorbidities Comorbidity 1  Comorbidities osteopenia  Examination-Activity Limitations Locomotion Level;Stand;Stairs  Examination-Participation Restrictions Community Activity;Yard Work;Cleaning  Pt will benefit from skilled therapeutic intervention in order to improve on the following deficits Abnormal gait;Decreased range of motion;Improper body mechanics;Decreased balance;Difficulty walking;Pain;Decreased  activity tolerance;Decreased strength;Increased fascial restricitons  Stability/Clinical Decision Making Stable/Uncomplicated  Rehab Potential Good  PT Frequency 2x / week  PT Duration 4 weeks  PT Treatment/Interventions ADLs/Self Care Home Management;Aquatic Therapy;Biofeedback;Cryotherapy;Electrical Stimulation;Iontophoresis '4mg'$ /ml Dexamethasone;Moist Heat;Traction;Balance training;Manual techniques;Manual lymph drainage;Therapeutic exercise;Vasopneumatic Device;Taping;Functional mobility training;Therapeutic activities;Orthotic Fit/Training;Energy conservation;Dry needling;Gait training;Stair training;DME Instruction;Patient/family education;Passive range of motion;Spinal Manipulations;Joint Manipulations;Visual/perceptual remediation/compensation;Compression bandaging;Scar mobilization;Neuromuscular re-education;Ultrasound;Parrafin;Fluidtherapy;Contrast Bath  PT Next Visit Plan Progress isometric ankle strength, try single leg heel raise with hold, or eccentrics.  Emphasis on stabilization. Balance on foam, BAPs. Manual STM to posterior tibialis, assess reponse to k taping. Continue gait with prolonged walking/ variable speeds. Continue with plyometric progressions  PT Home Exercise Plan Eval: heel raise with isometric 5" holds 9/6 try single leg heel raise 9/8 fwd/ lat step hops  Consulted and Agree with Plan of Care Patient        Patient will benefit from skilled therapeutic intervention in order to improve the following deficits and impairments:  Abnormal gait, Decreased range of motion, Improper body mechanics, Decreased balance, Difficulty walking, Pain, Decreased activity tolerance, Decreased strength, Increased fascial restricitons  Visit Diagnosis: Pain in left ankle and joints of left foot  Difficulty in walking, not elsewhere classified     Problem List Patient Active Problem List   Diagnosis Date Noted   Soft tissue mass, left shoulder 01/01/2013   5:53 PM,  12/15/20 Josue Hector PT DPT  Physical Therapist with [facility]  [phone] [place], [place], [address] Phone: [phone]   Fax:  [phone]  Name: Brandi Davis MRN: [id number] Date of Birth: 1960-10-11

## 2020-12-15 NOTE — Progress Notes (Signed)
   12/14/20 0823  Symptoms/Limitations  Subjective Doing alright. Thinks things are improving. Still has low level pain in ankle occasionally. Describes as worse this morning but better once she got up and went for a walk.  Pertinent History Osteopenia  Limitations Standing;Walking;House hold activities  Patient Stated Goals Be able to wear heels an boots, be able to do several activities in same day without ankle pain  Pain Assessment  Currently in Pain? Yes  Pain Score 2  Pain Location Ankle  Pain Orientation Left  Pain Descriptors / Indicators Sore

## 2020-12-15 NOTE — Progress Notes (Signed)
   12/14/20 0001  Knee/Hip Exercises: Aerobic  Tread Mill 10 min ramping elevation to 8%, speed ramp to 3.37mh  Knee/Hip Exercises: Machines for Strengthening  Other Machine FWD and lateral walkouts on machine 30# x 5 each  Knee/Hip Exercises: Plyometrics  Other Plyometric Exercises 6 inch step depth jumps, focus on shock absortion and foot mechnics x10  Knee/Hip Exercises: Standing  Heel Raises 20 reps  Heel Raises Limitations 5 sec iso hold  Other Standing Knee Exercises FWD/ LAt step hops x 10 each  Other Standing Knee Exercises rocker board holds lateral 5 x 15",  SLS with Vectors 3 x 10" holds 3 way on foam

## 2020-12-15 NOTE — Progress Notes (Signed)
   12/14/20 0903  Plan  Clinical Impression Statement Patient tolerated ther ex progressions well and was well challnged with plyometric progressions. Patient noting improved pain symptoms end of session. Patient required increased verbal cues and demo for improved shcok absortion with landing phase of dpeth jumps. Patient will conitnue to beneift form skilled thepray servcies to reeduce deficits and improve funcitonal ability.  Personal Factors and Comorbidities Comorbidity 1  Comorbidities osteopenia  Examination-Activity Limitations Locomotion Level;Stand;Stairs  Examination-Participation Restrictions Community Activity;Yard Work;Cleaning  Pt will benefit from skilled therapeutic intervention in order to improve on the following deficits Abnormal gait;Decreased range of motion;Improper body mechanics;Decreased balance;Difficulty walking;Pain;Decreased activity tolerance;Decreased strength;Increased fascial restricitons  Stability/Clinical Decision Making Stable/Uncomplicated  Rehab Potential Good  PT Frequency 2x / week  PT Duration 4 weeks  PT Treatment/Interventions ADLs/Self Care Home Management;Aquatic Therapy;Biofeedback;Cryotherapy;Electrical Stimulation;Iontophoresis '4mg'$ /ml Dexamethasone;Moist Heat;Traction;Balance training;Manual techniques;Manual lymph drainage;Therapeutic exercise;Vasopneumatic Device;Taping;Functional mobility training;Therapeutic activities;Orthotic Fit/Training;Energy conservation;Dry needling;Gait training;Stair training;DME Instruction;Patient/family education;Passive range of motion;Spinal Manipulations;Joint Manipulations;Visual/perceptual remediation/compensation;Compression bandaging;Scar mobilization;Neuromuscular re-education;Ultrasound;Parrafin;Fluidtherapy;Contrast Bath  PT Next Visit Plan Progress isometric ankle strength, try single leg heel raise with hold, or eccentrics.  Emphasis on stabilization. Balance on foam, BAPs. Manual STM to posterior tibialis,  assess reponse to k taping. Continue gait with prolonged walking/ variable speeds. Continue with plyometric progressions  PT Home Exercise Plan Eval: heel raise with isometric 5" holds 9/6 try single leg heel raise 9/8 fwd/ lat step hops  Consulted and Agree with Plan of Care Patient

## 2020-12-16 ENCOUNTER — Other Ambulatory Visit: Payer: Self-pay

## 2020-12-16 ENCOUNTER — Ambulatory Visit (HOSPITAL_COMMUNITY): Payer: 59 | Admitting: Physical Therapy

## 2020-12-16 ENCOUNTER — Encounter (HOSPITAL_COMMUNITY): Payer: Self-pay | Admitting: Physical Therapy

## 2020-12-16 DIAGNOSIS — M25572 Pain in left ankle and joints of left foot: Secondary | ICD-10-CM | POA: Diagnosis not present

## 2020-12-16 DIAGNOSIS — R262 Difficulty in walking, not elsewhere classified: Secondary | ICD-10-CM

## 2020-12-16 NOTE — Therapy (Signed)
[facility] [address] [place], [place], [address] Phone: [phone]   Fax:  [phone]  Physical Therapy Treatment  Patient Details  Name: Brandi Davis MRN: [id number] Date of Birth: [birth year] Referring Provider (PT): Earnestine Leys MD   Encounter Date: 12/16/2020   PT End of Session - 12/16/20 0830     Visit Number 6    Number of Visits 8    Date for PT Re-Evaluation 12/29/20    Authorization Type [employer]    PT Start Time [phone]    PT Stop Time 0858    PT Time Calculation (min) 41 min    Activity Tolerance Patient tolerated treatment well    Behavior During Therapy [facility] for tasks assessed/performed             History reviewed. No pertinent past medical history.  Past Surgical History:  Procedure Laterality Date   BREAST BIOPSY     LAPAROSCOPY     x3    There were no vitals filed for this visit.   Subjective Assessment - 12/16/20 0826     Subjective Having slight pain in ankle today.    Pertinent History Osteopenia    Limitations Standing;Walking;House hold activities    Patient Stated Goals Be able to wear heels an boots, be able to do several activities in same day without ankle pain    Currently in Pain? Yes    Pain Score 3     Pain Location Ankle    Pain Orientation Left    Pain Descriptors / Indicators Sore;Aching                               [facility] Adult PT Treatment/Exercise - 12/16/20 0001       Knee/Hip Exercises: Stretches   Gastroc Stretch Both;3 reps;30 seconds      Knee/Hip Exercises: Aerobic   Tread Mill 10 min ramping elevation to 8%, speed ramp to 62mh      Knee/Hip Exercises: Standing   Heel Raises 20 reps    Heel Raises Limitations 5 sec iso hold    Step Down Left;1 set;15 reps;Step Height: 4"    SLS with Vectors 3 x 10" holds 3 way on foam    Other Standing Knee Exercises single leg heel raise x 10, single leg eccentric lowering x 10      Manual  Therapy   Manual Therapy Taping    Manual therapy comments completed separate form all other activity    Kinesiotex Facilitate Muscle                       PT Short Term Goals - 12/01/20 0948       PT SHORT TERM GOAL #1   Title Patient will be independent with initial HEP and self-management strategies to improve functional outcomes    Time 2    Period Weeks    Status New    Target Date 12/15/20               PT Long Term Goals - 12/01/20 1006       PT LONG TERM GOAL #1   Title Patient will report at least 85% overall improvement in subjective complaint to indicate improvement in ability to perform ADLs.    Time 4    Period Weeks    Status New    Target Date 12/29/20  PT LONG TERM GOAL #2   Title Patient will be able to maintain 60 seconds in single limb stance on compliant surface to demo improved ankle stability for increased functional mobility outdoors.    Time 4    Period Weeks    Status New    Target Date 12/29/20      PT LONG TERM GOAL #3   Title Patient will be able to go on daily walks >30 minutes with no increased ankle pain for restored functional mobility and QOL    Time 4    Period Weeks    Status New    Target Date 12/29/20      PT LONG TERM GOAL #4   Title Patient will be independent with advanced HEP and self-management strategies to improve functional outcomes    Time 4    Period Weeks    Status New    Target Date 12/29/20                   Plan - 12/16/20 0854     Clinical Impression Statement Patient noting increased discomfort in ankle today. Modified activity to accommodate increased discomfort. Tolerated single leg eccentric calf raises well. No increased pain with eccentric step downs. Improved static stability. Held on plyometric activity today per subjective report. Will resume activity as tolerated. From patient subjective, mild arthritis may be plausible differential.    Personal Factors and Comorbidities  Comorbidity 1    Comorbidities osteopenia    Examination-Activity Limitations Locomotion Level;Stand;Stairs    Examination-Participation Restrictions Community Activity;Yard Work;Cleaning    Stability/Clinical Decision Making Stable/Uncomplicated    Rehab Potential Good    PT Frequency 2x / week    PT Duration 4 weeks    PT Treatment/Interventions ADLs/Self Care Home Management;Aquatic Therapy;Biofeedback;Cryotherapy;Electrical Stimulation;Iontophoresis '4mg'$ /ml Dexamethasone;Moist Heat;Traction;Balance training;Manual techniques;Manual lymph drainage;Therapeutic exercise;Vasopneumatic Device;Taping;Functional mobility training;Therapeutic activities;Orthotic Fit/Training;Energy conservation;Dry needling;Gait training;Stair training;DME Instruction;Patient/family education;Passive range of motion;Spinal Manipulations;Joint Manipulations;Visual/perceptual remediation/compensation;Compression bandaging;Scar mobilization;Neuromuscular re-education;Ultrasound;Parrafin;Fluidtherapy;Contrast Bath    PT Next Visit Plan Progress isometric ankle strength, try single leg heel raise with hold, or eccentrics.  Emphasis on stabilization. Balance on foam, BAPs. Manual STM to posterior tibialis, assess reponse to k taping. Continue gait with prolonged walking/ variable speeds. Continue with plyometric progressions    PT Home Exercise Plan Eval: heel raise with isometric 5" holds 9/6 try single leg heel raise 9/8 fwd/ lat step hops    Consulted and Agree with Plan of Care Patient             Patient will benefit from skilled therapeutic intervention in order to improve the following deficits and impairments:  Abnormal gait, Decreased range of motion, Improper body mechanics, Decreased balance, Difficulty walking, Pain, Decreased activity tolerance, Decreased strength, Increased fascial restricitons  Visit Diagnosis: Pain in left ankle and joints of left foot  Difficulty in walking, not elsewhere  classified     Problem List Patient Active Problem List   Diagnosis Date Noted   Soft tissue mass, left shoulder 01/01/2013   9:04 AM, 12/16/20 Brandi Davis  Physical Therapist with [facility]  [phone] [place], [place], [address] Phone: [phone]   Fax:  [phone]  Name: Brandi Davis MRN: [id number] Date of Birth: 27-Mar-1961

## 2020-12-21 ENCOUNTER — Ambulatory Visit (HOSPITAL_COMMUNITY): Payer: 59 | Admitting: Physical Therapy

## 2020-12-21 ENCOUNTER — Encounter (HOSPITAL_COMMUNITY): Payer: Self-pay | Admitting: Physical Therapy

## 2020-12-21 ENCOUNTER — Other Ambulatory Visit: Payer: Self-pay

## 2020-12-21 DIAGNOSIS — M25572 Pain in left ankle and joints of left foot: Secondary | ICD-10-CM | POA: Diagnosis not present

## 2020-12-21 DIAGNOSIS — R262 Difficulty in walking, not elsewhere classified: Secondary | ICD-10-CM

## 2020-12-21 NOTE — Therapy (Signed)
[facility] [address] [place], [place], [address] Phone: [phone]   Fax:  [phone]  Physical Therapy Treatment  Patient Details  Name: Brandi Davis MRN: [id number] Date of Birth: 21-Mar-1961 Referring Provider (PT): Earnestine Leys MD   Encounter Date: 12/21/2020   PT End of Session - 12/21/20 1328     Visit Number 7    Number of Visits 8    Date for PT Re-Evaluation 12/29/20    Authorization Type [employer]    PT Start Time 1941    PT Stop Time 1350    PT Time Calculation (min) 47 min    Activity Tolerance Patient tolerated treatment well    Behavior During Therapy [facility] for tasks assessed/performed             History reviewed. No pertinent past medical history.  Past Surgical History:  Procedure Laterality Date   BREAST BIOPSY     LAPAROSCOPY     x3    There were no vitals filed for this visit.   Subjective Assessment - 12/21/20 1321     Subjective Patient states she had followed up with MD and did not go well. She states she was told she is doing too much. Also, she should be wearing an ankle brace. Patient reports frustration with opposing opinion and reports overall improvement in functional level. Current ankle pain 1/10.    Pertinent History Osteopenia    Limitations Standing;Walking;House hold activities    Currently in Pain? Yes    Pain Score 1     Pain Location Ankle    Pain Orientation Left    Pain Descriptors / Indicators Aching    Pain Type Acute pain    Pain Onset More than a month ago    Pain Frequency Intermittent                               [facility] Adult PT Treatment/Exercise - 12/21/20 0001       Knee/Hip Exercises: Aerobic   Tread Mill 10 min ramping elevation to 8%, speed ramp to 36mph      Knee/Hip Exercises: Standing   Heel Raises 20 reps    Heel Raises Limitations 20 toe raises 5 sec iso hold    Step Down Both;2 sets;10 reps;Step Height: 2"    Step  Down Limitations eccentric heel tap down                       PT Short Term Goals - 12/01/20 0948       PT SHORT TERM GOAL #1   Title Patient will be independent with initial HEP and self-management strategies to improve functional outcomes    Time 2    Period Weeks    Status New    Target Date 12/15/20               PT Long Term Goals - 12/01/20 1006       PT LONG TERM GOAL #1   Title Patient will report at least 85% overall improvement in subjective complaint to indicate improvement in ability to perform ADLs.    Time 4    Period Weeks    Status New    Target Date 12/29/20      PT LONG TERM GOAL #2   Title Patient will be able to maintain 60 seconds in single limb stance on compliant  surface to demo improved ankle stability for increased functional mobility outdoors.    Time 4    Period Weeks    Status New    Target Date 12/29/20      PT LONG TERM GOAL #3   Title Patient will be able to go on daily walks >30 minutes with no increased ankle pain for restored functional mobility and QOL    Time 4    Period Weeks    Status New    Target Date 12/29/20      PT LONG TERM GOAL #4   Title Patient will be independent with advanced HEP and self-management strategies to improve functional outcomes    Time 4    Period Weeks    Status New    Target Date 12/29/20                   Plan - 12/21/20 1443     Clinical Impression Statement Patient tolerated session well today. discussed even weight distribution through foot with single leg activity. Patient shows fairly good mechanics with lunging position as well as single leg heel tap downs. Minimal instability noted, though ongoing low-level pain (1-2/10). Patient did note improved symptoms with verbal cues to spread weight throughout foot and avoid posterior weight shifting. Also discussed activity modification to try daily walking routine on more level terrain and assess pain response. Will follow up  next session. Reassessment next visit.    Personal Factors and Comorbidities Comorbidity 1    Comorbidities osteopenia    Examination-Activity Limitations Locomotion Level;Stand;Stairs    Examination-Participation Restrictions Community Activity;Yard Work;Cleaning    Stability/Clinical Decision Making Stable/Uncomplicated    Rehab Potential Good    PT Frequency 2x / week    PT Duration 4 weeks    PT Treatment/Interventions ADLs/Self Care Home Management;Aquatic Therapy;Biofeedback;Cryotherapy;Electrical Stimulation;Iontophoresis 4mg /ml Dexamethasone;Moist Heat;Traction;Balance training;Manual techniques;Manual lymph drainage;Therapeutic exercise;Vasopneumatic Device;Taping;Functional mobility training;Therapeutic activities;Orthotic Fit/Training;Energy conservation;Dry needling;Gait training;Stair training;DME Instruction;Patient/family education;Passive range of motion;Spinal Manipulations;Joint Manipulations;Visual/perceptual remediation/compensation;Compression bandaging;Scar mobilization;Neuromuscular re-education;Ultrasound;Parrafin;Fluidtherapy;Contrast Bath    PT Next Visit Plan Emphasis on ankle stabilization. Continue taping. gait with prolonged walking/ variable speeds. Continue with plyometric progressions as tolerated    PT Home Exercise Plan Eval: heel raise with isometric 5" holds 9/6 try single leg heel raise 9/8 fwd/ lat step hops 9/20 toe raise holds    Consulted and Agree with Plan of Care Patient             Patient will benefit from skilled therapeutic intervention in order to improve the following deficits and impairments:  Abnormal gait, Decreased range of motion, Improper body mechanics, Decreased balance, Difficulty walking, Pain, Decreased activity tolerance, Decreased strength, Increased fascial restricitons  Visit Diagnosis: Pain in left ankle and joints of left foot  Difficulty in walking, not elsewhere classified     Problem List Patient Active Problem List    Diagnosis Date Noted   Soft tissue mass, left shoulder 01/01/2013   2:53 PM, 12/21/20 Brandi Davis  Physical Therapist with [facility]  [phone] [place], [place], [address] Phone: [phone]   Fax:  [phone]  Name: Brandi Davis MRN: [id number] Date of Birth: 06/11/1960

## 2020-12-23 ENCOUNTER — Other Ambulatory Visit: Payer: Self-pay

## 2020-12-23 ENCOUNTER — Encounter (HOSPITAL_COMMUNITY): Payer: Self-pay | Admitting: Physical Therapy

## 2020-12-23 ENCOUNTER — Ambulatory Visit (HOSPITAL_COMMUNITY): Payer: 59 | Admitting: Physical Therapy

## 2020-12-23 DIAGNOSIS — R262 Difficulty in walking, not elsewhere classified: Secondary | ICD-10-CM

## 2020-12-23 DIAGNOSIS — M25572 Pain in left ankle and joints of left foot: Secondary | ICD-10-CM | POA: Diagnosis not present

## 2020-12-23 NOTE — Therapy (Signed)
[facility] [place], [place], [address] Phone: [phone]   Fax:  [phone]  Physical Therapy Treatment  Patient Details  Name: Brandi Davis MRN: [id number] Date of Birth: 1960-11-29 Referring Provider (PT): Earnestine Leys MD  Progress Note Reporting Period 8.31.22 to 9.22.22  See note below for Objective Data and Assessment of Progress/Goals.      Encounter Date: 12/23/2020   PT End of Session - 12/23/20 0826     Visit Number 8    Number of Visits 16    Date for PT Re-Evaluation 01/20/21    Authorization Type [employer]    PT Start Time [phone]    PT Stop Time 0900    PT Time Calculation (min) 43 min    Activity Tolerance Patient tolerated treatment well    Behavior During Therapy [facility] for tasks assessed/performed             History reviewed. No pertinent past medical history.  Past Surgical History:  Procedure Laterality Date   BREAST BIOPSY     LAPAROSCOPY     x3    There were no vitals filed for this visit.   Subjective Assessment - 12/23/20 0824     Subjective Patient says she walked on flatter surface the other day and this was a little better. She still notes some pain in same area, notes increased sensation in heels during toe raises. She does report about 90% improvement in function since starting therapy.    Pertinent History Osteopenia    Limitations Standing;Walking;House hold activities    Currently in Pain? Yes    Pain Score 2     Pain Location Ankle    Pain Orientation Left    Pain Descriptors / Indicators Aching    Pain Onset More than a month ago    Pain Frequency Intermittent                [facility] PT Assessment - 12/23/20 0001       Assessment   Medical Diagnosis LT posterior tibial tendonitis    Referring Provider (PT) Earnestine Leys MD    Prior [place] residence      Prior Function   Level of  Independence Independent      Cognition   Overall Cognitive Status Within Functional Limits for tasks assessed      AROM   Left Ankle Dorsiflexion 10    Left Ankle Plantar Flexion 55      Strength   Left Ankle Dorsiflexion 5/5    Left Ankle Plantar Flexion 4+/5    Left Ankle Inversion 4+/5    Left Ankle Eversion 4+/5                           [facility] Adult PT Treatment/Exercise - 12/23/20 0001       Knee/Hip Exercises: Aerobic   Tread Mill 12 min ramping elevation to 6%, speed ramp to 26mph      Knee/Hip Exercises: Standing   Heel Raises 15 reps    Heel Raises Limitations 15 reps 5 sec holds    Step Down 2 sets;10 reps;Step Height: 4"    Step Down Limitations eccentric heel tap down   noted imporved symptoms performing barefoot   Other Standing Knee Exercises single leg eccentric heel raises on LT 2 x 10    Other Standing  Knee Exercises 3 RT heel walking, carioca 3 RT      Knee/Hip Exercises: Sidelying   Other Sidelying Knee/Hip Exercises side plank with hip abduction 5 x 10"                       PT Short Term Goals - 12/23/20 0858       PT SHORT TERM GOAL #1   Title Patient will be independent with initial HEP and self-management strategies to improve functional outcomes    Time 2    Period Weeks    Status Achieved    Target Date 12/15/20               PT Long Term Goals - 12/23/20 0858       PT LONG TERM GOAL #1   Title Patient will report at least 85% overall improvement in subjective complaint to indicate improvement in ability to perform ADLs.    Baseline Reports 90%    Time 4    Period Weeks    Status Achieved      PT LONG TERM GOAL #2   Title Patient will be able to maintain 60 seconds in single limb stance on compliant surface to demo improved ankle stability for increased functional mobility outdoors.    Baseline able to hold 60 seconds but with noted sway/ instability    Time 4    Period Weeks    Status On-going       PT LONG TERM GOAL #3   Title Patient will be able to go on daily walks >30 minutes with no increased ankle pain for restored functional mobility and QOL    Baseline Daily walks 40 minutes but with increased ankle pain currently (3/10)    Time 4    Period Weeks    Status On-going      PT LONG TERM GOAL #4   Title Patient will be independent with advanced HEP and self-management strategies to improve functional outcomes    Time 4    Period Weeks    Status On-going                   Plan - 12/23/20 0900     Clinical Impression Statement Patient is making steady improvements in function. She is able to tolerate more challenging activity but with ongoing low-level pain in lateral left ankle. Pain level does appear relatively stable ranging from about 1-3/10, and resolving within 1-2 days of resting. We have extensively discussed biomechanics and pacing of daily activity. Patient is highly active and often performs multiple exercise routines on a daily basis. This has somewhat compounded ability to fully resolve ankle pain, but does appear directly related to level of activity. At this point, patient may well benefit from continued therapy services to progress ankle stabilization and further fine tune level of daily activity to ultimately reduce ankle pain and allow patient to perform ADLs and recreational activity at PLOF for improved quality of life.    Personal Factors and Comorbidities Comorbidity 1    Comorbidities osteopenia    Examination-Activity Limitations Locomotion Level;Stand;Stairs    Examination-Participation Restrictions Community Activity;Yard Work;Cleaning    Stability/Clinical Decision Making Stable/Uncomplicated    Rehab Potential Good    PT Frequency 2x / week    PT Duration 4 weeks    PT Treatment/Interventions ADLs/Self Care Home Management;Aquatic Therapy;Biofeedback;Cryotherapy;Electrical Stimulation;Iontophoresis 4mg /ml Dexamethasone;Moist  Heat;Traction;Balance training;Manual techniques;Manual lymph drainage;Therapeutic exercise;Vasopneumatic Device;Taping;Functional mobility training;Therapeutic activities;Orthotic Fit/Training;Energy conservation;Dry needling;Gait  training;Stair training;DME Instruction;Patient/family education;Passive range of motion;Spinal Manipulations;Joint Manipulations;Visual/perceptual remediation/compensation;Compression bandaging;Scar mobilization;Neuromuscular re-education;Ultrasound;Parrafin;Fluidtherapy;Contrast Bath    PT Next Visit Plan Emphasis on ankle stabilization. Continue taping. gait with prolonged walking/ variable speeds. Integrate low level plyometric activity if able.    PT Home Exercise Plan Eval: heel raise with isometric 5" holds 9/6 try single leg heel raise 9/8 fwd/ lat step hops 9/20 toe raise holds 9/22 side plank with hip abduction    Consulted and Agree with Plan of Care Patient             Patient will benefit from skilled therapeutic intervention in order to improve the following deficits and impairments:  Abnormal gait, Decreased range of motion, Improper body mechanics, Decreased balance, Difficulty walking, Pain, Decreased activity tolerance, Decreased strength, Increased fascial restricitons  Visit Diagnosis: Pain in left ankle and joints of left foot  Difficulty in walking, not elsewhere classified     Problem List Patient Active Problem List   Diagnosis Date Noted   Soft tissue mass, left shoulder 01/01/2013   12:01 PM, 12/23/20 Josue Hector PT DPT  Physical Therapist with [facility]  [phone] [place] [address] [place], [place], [address] Phone: [phone]   Fax:  [phone]  Name: CARELI LUZADER MRN: [id number] Date of Birth: Mar 04, 1961

## 2020-12-28 ENCOUNTER — Encounter (HOSPITAL_COMMUNITY): Payer: 59 | Admitting: Physical Therapy

## 2020-12-30 ENCOUNTER — Encounter (HOSPITAL_COMMUNITY): Payer: 59 | Admitting: Physical Therapy

## 2021-01-03 ENCOUNTER — Encounter (HOSPITAL_COMMUNITY): Payer: Self-pay

## 2021-01-03 ENCOUNTER — Other Ambulatory Visit: Payer: Self-pay

## 2021-01-03 ENCOUNTER — Ambulatory Visit (HOSPITAL_COMMUNITY): Payer: 59 | Attending: Podiatry

## 2021-01-03 DIAGNOSIS — M25572 Pain in left ankle and joints of left foot: Secondary | ICD-10-CM | POA: Diagnosis present

## 2021-01-03 DIAGNOSIS — R262 Difficulty in walking, not elsewhere classified: Secondary | ICD-10-CM | POA: Diagnosis present

## 2021-01-03 NOTE — Therapy (Signed)
[facility] [address] [place], [place], [address] Phone: [phone]   Fax:  [phone]  Physical Therapy Treatment  Patient Details  Name: SERIAH BROTZMAN MRN: [id number] Date of Birth: 1960-07-10 Referring Provider (PT): Earnestine Leys MD   Encounter Date: 01/03/2021   PT End of Session - 01/03/21 1150     Visit Number 9    Number of Visits 16    Date for PT Re-Evaluation 01/20/21    Authorization Type [employer]    PT Start Time 1150    PT Stop Time 3009    PT Time Calculation (min) 45 min    Activity Tolerance Patient tolerated treatment well    Behavior During Therapy [facility] for tasks assessed/performed             History reviewed. No pertinent past medical history.  Past Surgical History:  Procedure Laterality Date   BREAST BIOPSY     LAPAROSCOPY     x3    There were no vitals filed for this visit.   Subjective Assessment - 01/03/21 1149     Subjective Patient says she walked 10 total miles [place] at the beach three days in a row last week. Pain seems to be the same.    Pertinent History Osteopenia    Limitations Standing;Walking;House hold activities    Pain Onset More than a month ago                [facility] PT Assessment - 01/03/21 0001       Posture/Postural Control   Posture/Postural Control Postural limitations    Posture Comments left greater than right pronation                           [facility] Adult PT Treatment/Exercise - 01/03/21 0001       Self-Care   Self-Care Posture    Posture additional arch support added to bilateral orthotics      Knee/Hip Exercises: Standing   Other Standing Knee Exercises rebounder 500g and 2000 g ball toss in SLS/solid and tandem on foam x10 each      Manual Therapy   Manual Therapy Soft tissue mobilization    Manual therapy comments completed separate form all other activity    Soft tissue mobilization posterior tibialis, flexor  hallicus longus, flexor digitorum longus                     PT Education - 01/03/21 1239     Education Details RICE decreased inflammation/pain. Muscular/tendonis injury cycle.    Person(s) Educated Patient    Methods Explanation    Comprehension Verbalized understanding              PT Short Term Goals - 12/23/20 0858       PT SHORT TERM GOAL #1   Title Patient will be independent with initial HEP and self-management strategies to improve functional outcomes    Time 2    Period Weeks    Status Achieved    Target Date 12/15/20               PT Long Term Goals - 12/23/20 0858       PT LONG TERM GOAL #1   Title Patient will report at least 85% overall improvement in subjective complaint to indicate improvement in ability to perform ADLs.    Baseline Reports 90%    Time 4  Period Weeks    Status Achieved      PT LONG TERM GOAL #2   Title Patient will be able to maintain 60 seconds in single limb stance on compliant surface to demo improved ankle stability for increased functional mobility outdoors.    Baseline able to hold 60 seconds but with noted sway/ instability    Time 4    Period Weeks    Status On-going      PT LONG TERM GOAL #3   Title Patient will be able to go on daily walks >30 minutes with no increased ankle pain for restored functional mobility and QOL    Baseline Daily walks 40 minutes but with increased ankle pain currently (3/10)    Time 4    Period Weeks    Status On-going      PT LONG TERM GOAL #4   Title Patient will be independent with advanced HEP and self-management strategies to improve functional outcomes    Time 4    Period Weeks    Status On-going                   Plan - 01/03/21 1150     Clinical Impression Statement Session focused on assessing soft tissue adhesions and trigger points, assessing talocrural alignment, modifying patient's current plantar fasciitis orthotics, and educating patient on  importance of resting injury to decrease inflammation and reduce pain. Left greater than right arch exhibited pronation. Additional arch support added to bilateral orthotics. Trigger point noted in posterior tibialis muscle belly. PT recommended patient place a hold on flex lifting and walking activities for 2 weeks, replacing with recumbent biking for 10-20 minutes. Patient exhibited decreased balance on left lower extremity in comparison to right. Patient would benefit from continue skilled physical therapy to reduce impairment and improve function.    Personal Factors and Comorbidities Comorbidity 1    Comorbidities osteopenia    Examination-Activity Limitations Locomotion Level;Stand;Stairs    Examination-Participation Restrictions Community Activity;Yard Work;Cleaning    Stability/Clinical Decision Making Stable/Uncomplicated    Rehab Potential Good    PT Frequency 2x / week    PT Duration 4 weeks    PT Treatment/Interventions ADLs/Self Care Home Management;Aquatic Therapy;Biofeedback;Cryotherapy;Electrical Stimulation;Iontophoresis 4mg /ml Dexamethasone;Moist Heat;Traction;Balance training;Manual techniques;Manual lymph drainage;Therapeutic exercise;Vasopneumatic Device;Taping;Functional mobility training;Therapeutic activities;Orthotic Fit/Training;Energy conservation;Dry needling;Gait training;Stair training;DME Instruction;Patient/family education;Passive range of motion;Spinal Manipulations;Joint Manipulations;Visual/perceptual remediation/compensation;Compression bandaging;Scar mobilization;Neuromuscular re-education;Ultrasound;Parrafin;Fluidtherapy;Contrast Bath    PT Next Visit Plan Emphasis on ankle stabilization. Continue taping. gait with prolonged walking/ variable speeds. Integrate low level plyometric activity if able.    PT Home Exercise Plan Eval: heel raise with isometric 5" holds 9/6 try single leg heel raise 9/8 fwd/ lat step hops 9/20 toe raise holds 9/22 side plank with hip  abduction    Consulted and Agree with Plan of Care Patient             Patient will benefit from skilled therapeutic intervention in order to improve the following deficits and impairments:  Abnormal gait, Decreased range of motion, Improper body mechanics, Decreased balance, Difficulty walking, Pain, Decreased activity tolerance, Decreased strength, Increased fascial restricitons  Visit Diagnosis: Pain in left ankle and joints of left foot  Difficulty in walking, not elsewhere classified     Problem List Patient Active Problem List   Diagnosis Date Noted   Soft tissue mass, left shoulder 01/01/2013   Floria Raveling. Hartnett-Rands, MS, PT Per [place] [phone]  Jeannie Done, PT 01/03/2021, 12:53 PM  [place], [place], [address] Phone: [phone]   Fax:  [phone]  Name: STASSI FADELY MRN: [id number] Date of Birth: December 07, 1960

## 2021-01-06 ENCOUNTER — Ambulatory Visit (HOSPITAL_COMMUNITY): Payer: 59

## 2021-01-06 ENCOUNTER — Encounter (HOSPITAL_COMMUNITY): Payer: Self-pay

## 2021-01-06 ENCOUNTER — Other Ambulatory Visit: Payer: Self-pay

## 2021-01-06 DIAGNOSIS — R262 Difficulty in walking, not elsewhere classified: Secondary | ICD-10-CM

## 2021-01-06 DIAGNOSIS — M25572 Pain in left ankle and joints of left foot: Secondary | ICD-10-CM

## 2021-01-06 NOTE — Therapy (Signed)
[facility] [address] [place], [place], [address] Phone: [phone]   Fax:  [phone]  Physical Therapy Treatment  Patient Details  Name: Brandi Davis MRN: [id number] Date of Birth: 06/15/1960 Referring Provider (PT): Earnestine Leys MD   Encounter Date: 01/06/2021   PT End of Session - 01/06/21 1202     Visit Number 10    Number of Visits 16    Date for PT Re-Evaluation 01/20/21    Authorization Type [place] - Visit Number 10    Authorization - Number of Visits 30    Progress Note Due on Visit 16    PT Start Time 1133    PT Stop Time 1211    PT Time Calculation (min) 38 min    Activity Tolerance Patient tolerated treatment well    Behavior During Therapy [facility] for tasks assessed/performed             History reviewed. No pertinent past medical history.  Past Surgical History:  Procedure Laterality Date   BREAST BIOPSY     LAPAROSCOPY     x3    There were no vitals filed for this visit.   Subjective Assessment - 01/06/21 1134     Subjective Pt stated she has some discomfort with new orthotics, has been riding bike vs walking helped wiht pain.    Pertinent History Osteopenia    Patient Stated Goals Be able to wear heels an boots, be able to do several activities in same day without ankle pain    Currently in Pain? No/denies                               [facility] Adult PT Treatment/Exercise - 01/06/21 0001       Self-Care   Self-Care Posture    Posture additional arch support added to bilateral orthotics      Knee/Hip Exercises: Standing   Heel Raises 15 reps    Step Down 2 sets;10 reps;Step Height: 4"    Step Down Limitations eccentric heel tap down    Other Standing Knee Exercises yellow theraball rebounder SLS on foam 10x each, increased difficulty wiht Lt> Rt    Other Standing Knee Exercises intrinsic strengthening barefoot to improve balance, arch  formation      Manual Therapy   Manual Therapy Soft tissue mobilization    Manual therapy comments completed separate form all other activity    Soft tissue mobilization posterior tibialis, flexor hallicus longus, flexor digitorum longus                       PT Short Term Goals - 12/23/20 0858       PT SHORT TERM GOAL #1   Title Patient will be independent with initial HEP and self-management strategies to improve functional outcomes    Time 2    Period Weeks    Status Achieved    Target Date 12/15/20               PT Long Term Goals - 12/23/20 0858       PT LONG TERM GOAL #1   Title Patient will report at least 85% overall improvement in subjective complaint to indicate improvement in ability to perform ADLs.    Baseline Reports 90%    Time 4    Period Weeks    Status Achieved  PT LONG TERM GOAL #2   Title Patient will be able to maintain 60 seconds in single limb stance on compliant surface to demo improved ankle stability for increased functional mobility outdoors.    Baseline able to hold 60 seconds but with noted sway/ instability    Time 4    Period Weeks    Status On-going      PT LONG TERM GOAL #3   Title Patient will be able to go on daily walks >30 minutes with no increased ankle pain for restored functional mobility and QOL    Baseline Daily walks 40 minutes but with increased ankle pain currently (3/10)    Time 4    Period Weeks    Status On-going      PT LONG TERM GOAL #4   Title Patient will be independent with advanced HEP and self-management strategies to improve functional outcomes    Time 4    Period Weeks    Status On-going                   Plan - 01/06/21 1307     Clinical Impression Statement Session focus on intrinsic strengthening to improve arch formation and ankle alignment.  Pt presents with decreased balance Lt>Rt during SLS activities.  Noted trigger points posterior tib with reports of improvements  following manual STM.  No reports of pain through session.    Personal Factors and Comorbidities Comorbidity 1    Comorbidities osteopenia    Examination-Activity Limitations Locomotion Level;Stand;Stairs    Examination-Participation Restrictions Community Activity;Yard Work;Cleaning    Stability/Clinical Decision Making Stable/Uncomplicated    Clinical Decision Making Low    Rehab Potential Good    PT Frequency 2x / week    PT Duration 4 weeks    PT Treatment/Interventions ADLs/Self Care Home Management;Aquatic Therapy;Biofeedback;Cryotherapy;Electrical Stimulation;Iontophoresis 4mg /ml Dexamethasone;Moist Heat;Traction;Balance training;Manual techniques;Manual lymph drainage;Therapeutic exercise;Vasopneumatic Device;Taping;Functional mobility training;Therapeutic activities;Orthotic Fit/Training;Energy conservation;Dry needling;Gait training;Stair training;DME Instruction;Patient/family education;Passive range of motion;Spinal Manipulations;Joint Manipulations;Visual/perceptual remediation/compensation;Compression bandaging;Scar mobilization;Neuromuscular re-education;Ultrasound;Parrafin;Fluidtherapy;Contrast Bath    PT Next Visit Plan Emphasis on ankle stabilization. Continue taping. gait with prolonged walking/ variable speeds. Integrate low level plyometric activity if able.    PT Home Exercise Plan Eval: heel raise with isometric 5" holds 9/6 try single leg heel raise 9/8 fwd/ lat step hops 9/20 toe raise holds 9/22 side plank with hip abduction    Consulted and Agree with Plan of Care Patient             Patient will benefit from skilled therapeutic intervention in order to improve the following deficits and impairments:  Abnormal gait, Decreased range of motion, Improper body mechanics, Decreased balance, Difficulty walking, Pain, Decreased activity tolerance, Decreased strength, Increased fascial restricitons  Visit Diagnosis: Pain in left ankle and joints of left foot  Difficulty  in walking, not elsewhere classified     Problem List Patient Active Problem List   Diagnosis Date Noted   Soft tissue mass, left shoulder 01/01/2013   Ihor Austin, LPTA/CLT; CBIS [phone]  Aldona Lento, PTA 01/06/2021, 1:13 PM  [facility] [address] [place], [place], [address] Phone: [phone]   Fax:  [phone]  Name: CHEYNNE VIRDEN MRN: [id number] Date of Birth: 1960/05/01

## 2021-01-11 ENCOUNTER — Other Ambulatory Visit: Payer: Self-pay

## 2021-01-11 ENCOUNTER — Encounter (HOSPITAL_COMMUNITY): Payer: Self-pay | Admitting: Physical Therapy

## 2021-01-11 ENCOUNTER — Ambulatory Visit (HOSPITAL_COMMUNITY): Payer: 59 | Admitting: Physical Therapy

## 2021-01-11 DIAGNOSIS — M25572 Pain in left ankle and joints of left foot: Secondary | ICD-10-CM

## 2021-01-11 DIAGNOSIS — R262 Difficulty in walking, not elsewhere classified: Secondary | ICD-10-CM

## 2021-01-11 NOTE — Therapy (Signed)
[facility], [place], [address] Phone: [phone]   Fax:  [phone]  Physical Therapy Treatment  Patient Details  Name: Brandi Davis MRN: [id number] Date of Birth: 1960/06/03 Referring Provider (PT): Earnestine Leys MD   Encounter Date: 01/11/2021   PT End of Session - 01/11/21 1449     Visit Number 11    Number of Visits 16    Date for PT Re-Evaluation 01/20/21    Authorization Type [place] - Visit Number 11    Authorization - Number of Visits 30    Progress Note Due on Visit 16    PT Start Time 0109    PT Stop Time 1520    PT Time Calculation (min) 45 min    Activity Tolerance Patient tolerated treatment well    Behavior During Therapy [facility] for tasks assessed/performed             History reviewed. No pertinent past medical history.  Past Surgical History:  Procedure Laterality Date   BREAST BIOPSY     LAPAROSCOPY     x3    There were no vitals filed for this visit.   Subjective Assessment - 01/11/21 1445     Subjective Patient says she is doing better overall. Still having low level pain in ankle. She has held off on power flex class and walking daily and feels this has not made an improvement in ankle pain.    Pertinent History Osteopenia    Patient Stated Goals Be able to wear heels an boots, be able to do several activities in same day without ankle pain    Currently in Pain? Yes    Pain Score 2     Pain Location Ankle    Pain Orientation Left    Pain Descriptors / Indicators Aching    Pain Type Acute pain    Pain Onset More than a month ago    Pain Frequency Intermittent                               [facility] Adult PT Treatment/Exercise - 01/11/21 0001       Knee/Hip Exercises: Aerobic   Tread Mill 10 min ramping elevation to 6%, speed ramp to 1mph      Knee/Hip Exercises: Standing   Step Down Both;Step Height: 6";Step Height:  4";2 sets;10 reps   reassessment     Knee/Hip Exercises: Sidelying   Other Sidelying Knee/Hip Exercises Side plank both sides 30 seconds x 2 sets      Manual Therapy   Manual Therapy Joint mobilization;Soft tissue mobilization    Manual therapy comments completed separate form all other activity    Joint Mobilization Lt ankle distal fib glides A/P Grade II-III for pain free ankle eversion    Soft tissue mobilization STM to LT posterior tibialis                       PT Short Term Goals - 12/23/20 0858       PT SHORT TERM GOAL #1   Title Patient will be independent with initial HEP and self-management strategies to improve functional outcomes    Time 2    Period Weeks    Status Achieved    Target Date 12/15/20               PT Long Term  Goals - 12/23/20 0858       PT LONG TERM GOAL #1   Title Patient will report at least 85% overall improvement in subjective complaint to indicate improvement in ability to perform ADLs.    Baseline Reports 90%    Time 4    Period Weeks    Status Achieved      PT LONG TERM GOAL #2   Title Patient will be able to maintain 60 seconds in single limb stance on compliant surface to demo improved ankle stability for increased functional mobility outdoors.    Baseline able to hold 60 seconds but with noted sway/ instability    Time 4    Period Weeks    Status On-going      PT LONG TERM GOAL #3   Title Patient will be able to go on daily walks >30 minutes with no increased ankle pain for restored functional mobility and QOL    Baseline Daily walks 40 minutes but with increased ankle pain currently (3/10)    Time 4    Period Weeks    Status On-going      PT LONG TERM GOAL #4   Title Patient will be independent with advanced HEP and self-management strategies to improve functional outcomes    Time 4    Period Weeks    Status On-going                   Plan - 01/11/21 1536     Clinical Impression Statement  Patient tolerated session well today with no increased complaint of pain. Performed manual STM and joint mobilizations to improve muscle facilitation and pain free ankle eversion. Patient continues to demo slight calcaneal valgus in unsupported standing and has increased difficulty with single leg step downs on LLE. She is able to improve form with verbal cues. Demos good dynamic stabilization on LT of hip and core in side lying planks, though does report increased muscle fatigue. More challenged on RT with this. Patient will continue to benefit from ankle stabilization progressions and targeted manual interventions to reduce pain levels and improve functional ability.    Personal Factors and Comorbidities Comorbidity 1    Comorbidities osteopenia    Examination-Activity Limitations Locomotion Level;Stand;Stairs    Examination-Participation Restrictions Community Activity;Yard Work;Cleaning    Stability/Clinical Decision Making Stable/Uncomplicated    Rehab Potential Good    PT Frequency 2x / week    PT Duration 4 weeks    PT Treatment/Interventions ADLs/Self Care Home Management;Aquatic Therapy;Biofeedback;Cryotherapy;Electrical Stimulation;Iontophoresis 4mg /ml Dexamethasone;Moist Heat;Traction;Balance training;Manual techniques;Manual lymph drainage;Therapeutic exercise;Vasopneumatic Device;Taping;Functional mobility training;Therapeutic activities;Orthotic Fit/Training;Energy conservation;Dry needling;Gait training;Stair training;DME Instruction;Patient/family education;Passive range of motion;Spinal Manipulations;Joint Manipulations;Visual/perceptual remediation/compensation;Compression bandaging;Scar mobilization;Neuromuscular re-education;Ultrasound;Parrafin;Fluidtherapy;Contrast Bath    PT Next Visit Plan Emphasis on ankle stabilization. Continue taping. gait with prolonged walking/ variable speeds. Integrate low level plyometric activity if able.    PT Home Exercise Plan Eval: heel raise with  isometric 5" holds 9/6 try single leg heel raise 9/8 fwd/ lat step hops 9/20 toe raise holds 9/22 side plank with hip abduction    Consulted and Agree with Plan of Care Patient             Patient will benefit from skilled therapeutic intervention in order to improve the following deficits and impairments:  Abnormal gait, Decreased range of motion, Improper body mechanics, Decreased balance, Difficulty walking, Pain, Decreased activity tolerance, Decreased strength, Increased fascial restricitons  Visit Diagnosis: Pain in left ankle and joints of left foot  Difficulty  in walking, not elsewhere classified     Problem List Patient Active Problem List   Diagnosis Date Noted   Soft tissue mass, left shoulder 01/01/2013   3:42 PM, 01/11/21 Josue Hector PT DPT  Physical Therapist with [facility]  [phone] [place] [address] [place], [place], [address] Phone: [phone]   Fax:  [phone]  Name: Brandi Davis MRN: [id number] Date of Birth: [birth year]

## 2021-01-13 ENCOUNTER — Ambulatory Visit (HOSPITAL_COMMUNITY): Payer: 59 | Admitting: Physical Therapy

## 2021-01-13 ENCOUNTER — Other Ambulatory Visit: Payer: Self-pay

## 2021-01-13 ENCOUNTER — Encounter (HOSPITAL_COMMUNITY): Payer: Self-pay | Admitting: Physical Therapy

## 2021-01-13 DIAGNOSIS — M25572 Pain in left ankle and joints of left foot: Secondary | ICD-10-CM | POA: Diagnosis not present

## 2021-01-13 DIAGNOSIS — R262 Difficulty in walking, not elsewhere classified: Secondary | ICD-10-CM

## 2021-01-13 NOTE — Therapy (Signed)
[facility] [address] [place], [place], [address] Phone: [phone]   Fax:  [phone]  Physical Therapy Treatment  Patient Details  Name: Brandi Davis MRN: [id number] Date of Birth: 02-Feb-1961 Referring Provider (PT): Earnestine Leys MD  PHYSICAL THERAPY DISCHARGE SUMMARY  Visits from Start of Care: 12  Current functional level related to goals / functional outcomes: See below    Remaining deficits: See below    Education / Equipment: See assessment   Patient agrees to discharge. Patient goals were partially met. Patient is being discharged due to being pleased with the current functional level.   Encounter Date: 01/13/2021   PT End of Session - 01/13/21 1310     Visit Number 12    Number of Visits 16    Date for PT Re-Evaluation 01/20/21    Authorization Type Psychologist, clinical - Visit Number 11    Authorization - Number of Visits 30    Progress Note Due on Visit 16    PT Start Time 9242    PT Stop Time 1345    PT Time Calculation (min) 40 min    Activity Tolerance Patient tolerated treatment well    Behavior During Therapy WFL for tasks assessed/performed             History reviewed. No pertinent past medical history.  Past Surgical History:  Procedure Laterality Date   BREAST BIOPSY     LAPAROSCOPY     x3    There were no vitals filed for this visit.   Subjective Assessment - 01/13/21 1313     Subjective Patient feels better today. She did not walk yesterday because it rained. She feels she is near plateau and may be ready for DC from therapy today. Patient reports 95% improvement overall.    Pertinent History Osteopenia    Patient Stated Goals Be able to wear heels an boots, be able to do several activities in same day without ankle pain    Currently in Pain? No/denies    Pain Onset More than a month ago                [facility] PT Assessment - 01/13/21 0001        Assessment   Medical Diagnosis LT posterior tibial tendonitis    Referring Provider (PT) Earnestine Leys MD    Prior Therapy Yes      Precautions   Precautions None      Restrictions   Weight Bearing Restrictions No      Balance Screen   Has the patient fallen in the past 6 months No      Prior Function   Level of Independence Independent      Cognition   Overall Cognitive Status Within Functional Limits for tasks assessed      Observation/Other Assessments   Focus on Therapeutic Outcomes (FOTO)  91% function      Balance   Balance Assessed Yes      Static Standing Balance   Static Standing Balance -  Activities  Single Leg Stance - Right Leg;Single Leg Stance - Left Leg    Static Standing - Comment/# of Minutes >60 seconds on foam bilateral                           [facility] Adult PT Treatment/Exercise - 01/13/21 0001       Knee/Hip Exercises: Aerobic  Tread Mill 10 min ramping elevation to 6%, speed ramp to 2mh      Knee/Hip Exercises: Standing   Heel Raises 20 reps    Heel Raises Limitations 20 x toe raise isometric    SLS 3 x 60" on foam each                       PT Short Term Goals - 12/23/20 0858       PT SHORT TERM GOAL #1   Title Patient will be independent with initial HEP and self-management strategies to improve functional outcomes    Time 2    Period Weeks    Status Achieved    Target Date 12/15/20               PT Long Term Goals - 01/13/21 1332       PT LONG TERM GOAL #1   Title Patient will report at least 85% overall improvement in subjective complaint to indicate improvement in ability to perform ADLs.    Baseline Reports 95%    Time 4    Period Weeks    Status Achieved      PT LONG TERM GOAL #2   Title Patient will be able to maintain 60 seconds in single limb stance on compliant surface to demo improved ankle stability for increased functional mobility outdoors.    Baseline --    Time 4    Period  Weeks    Status Achieved      PT LONG TERM GOAL #3   Title Patient will be able to go on daily walks >30 minutes with no increased ankle pain for restored functional mobility and QOL    Baseline Daily walks 45 minutes but with increased ankle pain currently (3/10)    Time 4    Period Weeks    Status Partially Met      PT LONG TERM GOAL #4   Title Patient will be independent with advanced HEP and self-management strategies to improve functional outcomes    Baseline Reviewed with patient and answered all questions    Time 4    Period Weeks    Status Achieved                   Plan - 01/13/21 1505     Clinical Impression Statement Patient has made good progress toward therapy goals. Patient able to do all ADLs without limitation, but still notes minimal pain occasionally following increased activity. Discussed plan to stagger daily activity to reduce total work load any given day in effort to minimize remaining ankle irritation. Also discussed appropriate use of ankle brace and orthotics vs barefoot isolation activity for improving intrinsic foot musculature and ankle stability. Patient does remain limited by LT ankle valgus in unsupported standing, despite extensive work on foot mechanics and ankle proprioception but has made significant improvement in ability to hold single limb balance on compliant surface. Reviewed therapy HEP and answered all patient questions. Patient encouraged to follow up with therapy services with any further questions or concerns.    Personal Factors and Comorbidities Comorbidity 1    Comorbidities osteopenia    Examination-Activity Limitations Locomotion Level;Stand;Stairs    Examination-Participation Restrictions Community Activity;Yard Work;Cleaning    Stability/Clinical Decision Making Stable/Uncomplicated    Rehab Potential Good    PT Treatment/Interventions ADLs/Self Care Home Management;Aquatic Therapy;Biofeedback;Cryotherapy;Electrical  Stimulation;Iontophoresis 434mml Dexamethasone;Moist Heat;Traction;Balance training;Manual techniques;Manual lymph drainage;Therapeutic exercise;Vasopneumatic Device;Taping;Functional mobility training;Therapeutic activities;Orthotic Fit/Training;Energy conservation;Dry needling;Gait  training;Stair training;DME Instruction;Patient/family education;Passive range of motion;Spinal Manipulations;Joint Manipulations;Visual/perceptual remediation/compensation;Compression bandaging;Scar mobilization;Neuromuscular re-education;Ultrasound;Parrafin;Fluidtherapy;Contrast Bath    PT Next Visit Plan DC to HEP    PT Home Exercise Plan Eval: heel raise with isometric 5" holds 9/6 try single leg heel raise 9/8 fwd/ lat step hops 9/20 toe raise holds 9/22 side plank with hip abduction    Consulted and Agree with Plan of Care Patient             Patient will benefit from skilled therapeutic intervention in order to improve the following deficits and impairments:  Abnormal gait, Decreased range of motion, Improper body mechanics, Decreased balance, Difficulty walking, Pain, Decreased activity tolerance, Decreased strength, Increased fascial restricitons  Visit Diagnosis: Pain in left ankle and joints of left foot  Difficulty in walking, not elsewhere classified     Problem List Patient Active Problem List   Diagnosis Date Noted   Soft tissue mass, left shoulder 01/01/2013   3:09 PM, 01/13/21 Josue Hector PT DPT  Physical Therapist with [address]  [facility]  [phone] [place], [place], [address] Phone: [phone]   Fax:  [phone]  Name: Brandi Davis MRN: [id number] Date of Birth: 12/14/1960

## 2021-01-18 ENCOUNTER — Encounter (HOSPITAL_COMMUNITY): Payer: 59 | Admitting: Physical Therapy

## 2021-01-20 ENCOUNTER — Encounter (HOSPITAL_COMMUNITY): Payer: 59 | Admitting: Physical Therapy

## 2021-01-25 ENCOUNTER — Encounter (HOSPITAL_COMMUNITY): Payer: 59 | Admitting: Physical Therapy

## 2021-01-27 ENCOUNTER — Encounter (HOSPITAL_COMMUNITY): Payer: 59 | Admitting: Physical Therapy

## 2022-07-16 ENCOUNTER — Encounter (HOSPITAL_COMMUNITY): Payer: Self-pay

## 2022-07-16 ENCOUNTER — Emergency Department (HOSPITAL_COMMUNITY)
Admission: EM | Admit: 2022-07-16 | Discharge: 2022-07-16 | Disposition: A | Discharge status: Home / Self Care | Payer: 59 | Attending: Emergency Medicine | Admitting: Emergency Medicine

## 2022-07-16 ENCOUNTER — Telehealth: Payer: Self-pay | Admitting: Physician Assistant

## 2022-07-16 DIAGNOSIS — I10 Essential (primary) hypertension: Secondary | ICD-10-CM | POA: Insufficient documentation

## 2022-07-16 DIAGNOSIS — R112 Nausea with vomiting, unspecified: Secondary | ICD-10-CM | POA: Diagnosis not present

## 2022-07-16 DIAGNOSIS — R197 Diarrhea, unspecified: Secondary | ICD-10-CM | POA: Insufficient documentation

## 2022-07-16 DIAGNOSIS — R55 Syncope and collapse: Secondary | ICD-10-CM | POA: Insufficient documentation

## 2022-07-16 LAB — CBC
HCT: 42.5 % (ref 36.0–46.0)
Hemoglobin: 13.9 g/dL (ref 12.0–15.0)
MCH: 31.4 pg (ref 26.0–34.0)
MCHC: 32.7 g/dL (ref 30.0–36.0)
MCV: 95.9 fL (ref 80.0–100.0)
Platelets: 316 10*3/uL (ref 150–400)
RBC: 4.43 MIL/uL (ref 3.87–5.11)
RDW: 12.2 % (ref 11.5–15.5)
WBC: 14.1 10*3/uL — ABNORMAL HIGH (ref 4.0–10.5)
nRBC: 0 % (ref 0.0–0.2)

## 2022-07-16 LAB — BASIC METABOLIC PANEL
Anion gap: 9 (ref 5–15)
BUN: 20 mg/dL (ref 8–23)
CO2: 27 mmol/L (ref 22–32)
Calcium: 9.1 mg/dL (ref 8.9–10.3)
Chloride: 99 mmol/L (ref 98–111)
Creatinine, Ser: 0.86 mg/dL (ref 0.44–1.00)
GFR, Estimated: >60 mL/min (ref >60)
Glucose, Bld: 197 mg/dL — ABNORMAL HIGH (ref 70–99)
Potassium: 4 mmol/L (ref 3.5–5.1)
Sodium: 135 mmol/L (ref 135–145)

## 2022-07-16 LAB — CBG MONITORING, ED: Glucose-Capillary: 200 mg/dL — ABNORMAL HIGH (ref 70–99)

## 2022-07-16 NOTE — Discharge Instructions (Signed)
Stay hydrated at home.  Avoid missed meals.  Follow-up with your primary care doctor.  Return the emergency department at anytime for any further symptoms of concern.

## 2022-07-16 NOTE — ED Triage Notes (Signed)
Pt states that she had constipation yesterday. Today, she was sitting at a funeral and felt unwell- like she was going to pass out. Pt got in the car to go home (passenger) and had a syncopal episodes. Pt states that she had diarrhea and emesis when she got home. Pt c/o fatigue the last 2 days. Pt states she has not eaten much today either

## 2022-07-16 NOTE — Telephone Encounter (Signed)
Telephone Call 07/16/22   4:14 pm  Yesterday didn't feel good after ate lunch, then severe constipation, couldn't get out hard, then bled, then felt bad after. Really tired and went to bed early, didn't eat this am. Ate some oatmeal and a banana and a few sugar cookies and water and water.  Went to a funeral and ate a mint and started feeling really bad (Icebreaker mint)- nauseous and then hot/sweating and then felt like pass out.  Left funeral and passed out on way home in car (felt like had to go to bathroom bad) wasn't out long, but soiled clothing. Terrible diarrhea and then nausea and one more episode of a little vomiting. Still feel woozy and weak.   Recommend she go to the ER at [facility].  She is going to do that.  Explained that they will consult [facility] if needed.  Hyacinth Meeker, PA-C

## 2022-07-16 NOTE — ED Provider Notes (Signed)
[facility] EMERGENCY DEPARTMENT AT [facility] Provider Note   CSN: [id number] Arrival date & time: 07/16/22  1719     History  Chief Complaint  Patient presents with   Loss of Consciousness    Brandi Davis is a 62 y.o. female.   Loss of Consciousness Associated symptoms: nausea, vomiting and weakness (Generalized)   Patient presents for syncopal episode.  Medical history includes HTN, GERD, osteopenia.  She has had remote episodes of syncope.  Earlier this afternoon, at approximately 3:30 PM, she was at a funeral.  She felt fatigued and dizzy.  She went to her car where she had a brief syncopal episode.  This was witnessed by her husband.  Duration was only a few seconds.  Afterwards, she had ongoing generalized weakness and nausea.  She had a small emesis and some diarrhea when she got home.  She called her PCPs office who advised her to come to the ED.  Since arriving in the ED, she has had sustained resolution of symptoms over the past 3 hours.  Patient denies any current symptoms.  At baseline, she eats very little.  She had not eaten anything today.     Home Medications Prior to Admission medications   Medication Sig Start Date End Date Taking? Authorizing Provider  CALCIUM PO Take by mouth.    [provider]  cycloSPORINE (RESTASIS) 0.05 % ophthalmic emulsion 1 drop 2 (two) times daily.    [provider]  fish oil-omega-3 fatty acids 1000 MG capsule Take 2 g by mouth daily.    [provider]  Multiple Vitamin (MULTIVITAMIN) tablet Take 1 tablet by mouth daily.    [provider]  pantoprazole (PROTONIX) 40 MG tablet Take 40 mg by mouth daily.    [provider]      Allergies    Penicillins, Bacitracin-polymyxin b, Latex, and Neosporin + pain relief max st  [neomy-bacit-polymyx-pramoxine]    Review of Systems   Review of Systems  Constitutional:  Positive for fatigue.  Cardiovascular:  Positive for syncope.   Gastrointestinal:  Positive for diarrhea, nausea and vomiting.  Neurological:  Positive for syncope, weakness (Generalized) and light-headedness.  All other systems reviewed and are negative.   Physical Exam Updated Vital Signs BP 110/81 (BP Location: Right Arm)   Pulse 79   Temp 97.9 F (36.6 C) (Oral)   Resp 16   Ht 5\' 4"  (1.626 m)   Wt 52.2 kg   SpO2 100%   BMI 19.74 kg/m  Physical Exam Vitals and nursing note reviewed.  Constitutional:      General: She is not in acute distress.    Appearance: Normal appearance. She is well-developed. She is not ill-appearing, toxic-appearing or diaphoretic.  HENT:     Head: Normocephalic and atraumatic.     Right Ear: External ear normal.     Left Ear: External ear normal.     Nose: Nose normal.     Mouth/Throat:     Mouth: Mucous membranes are moist.  Eyes:     Extraocular Movements: Extraocular movements intact.     Conjunctiva/sclera: Conjunctivae normal.  Cardiovascular:     Rate and Rhythm: Normal rate and regular rhythm.     Heart sounds: No murmur heard. Pulmonary:     Effort: Pulmonary effort is normal. No respiratory distress.     Breath sounds: Normal breath sounds. No wheezing or rales.  Chest:     Chest wall: No tenderness.  Abdominal:  General: There is no distension.     Palpations: Abdomen is soft.     Tenderness: There is no abdominal tenderness.  Musculoskeletal:        General: No swelling. Normal range of motion.     Cervical back: Normal range of motion and neck supple.     Right lower leg: No edema.     Left lower leg: No edema.  Skin:    General: Skin is warm and dry.     Coloration: Skin is not jaundiced or pale.  Neurological:     General: No focal deficit present.     Mental Status: She is alert and oriented to person, place, and time.     Cranial Nerves: No cranial nerve deficit.     Sensory: No sensory deficit.     Motor: No weakness.     Coordination: Coordination normal.  Psychiatric:         Mood and Affect: Mood normal.        Behavior: Behavior normal.        Thought Content: Thought content normal.        Judgment: Judgment normal.     ED Results / Procedures / Treatments   Labs (all labs ordered are listed, but only abnormal results are displayed) Labs Reviewed  BASIC METABOLIC PANEL - Abnormal; Notable for the following components:      Result Value   Glucose, Bld 197 (*)    All other components within normal limits  CBC - Abnormal; Notable for the following components:   WBC 14.1 (*)    All other components within normal limits  CBG MONITORING, ED - Abnormal; Notable for the following components:   Glucose-Capillary 200 (*)    All other components within normal limits  URINALYSIS, ROUTINE W REFLEX MICROSCOPIC    EKG EKG Interpretation  Date/Time:  Sunday July 16 2022 18:09:57 EDT Ventricular Rate:  76 PR Interval:  146 QRS Duration: 85 QT Interval:  378 QTC Calculation: 425 R Axis:   88 Text Interpretation: Sinus rhythm Borderline right axis deviation Confirmed by Gloris Manchester ([id number]) on 07/16/2022 8:40:56 PM  Radiology No results found.  Procedures Procedures    Medications Ordered in ED Medications - No data to display  ED Course/ Medical Decision Making/ A&P                             Medical Decision Making Amount and/or Complexity of Data Reviewed Labs: ordered.   Patient is a healthy 62 year old female presenting after syncopal episode earlier today.  Patient had not eaten today.  This afternoon, she was at a funeral.  She felt lightheaded and dizzy.  She went to her car thinking she would pass out.  She did while seated in the car.  Episode lasted for only few seconds.  She was alert and oriented when she came to.  She had ongoing generalized weakness and fatigue.  She had 1 episode of emesis and some diarrhea at home.  She contacted her PCPs office who advised her to come to the ED.  Prior to being bedded in the ED, laboratory  workup was initiated.  Lab work is notable for leukocytosis.  On initial assessment, patient seated on ED stretcher.  She is well-appearing.  She states that her symptoms have resolved 3 hours ago.  She is requesting discharge home at this time.  Patient's breathing is currently unlabored.  Her lungs are  clear to auscultation.  No cardiac rubs or murmurs are appreciated.  EKG shows normal sinus rhythm.  Her abdomen is soft and without tenderness.  She has no further nausea.  She has no focal neurologic deficits and is able to ambulate without difficulty.  She was able to eat and drink in the ED.  Patient was offered further observation and testing but declined.  She states that she will hydrate at home.  She was discharged in stable condition.        Final Clinical Impression(s) / ED Diagnoses Final diagnoses:  Syncope, unspecified syncope type    Rx / DC Orders ED Discharge Orders     None         Gloris Manchester, MD 07/16/22 2102

## 2023-10-12 ENCOUNTER — Encounter (INDEPENDENT_AMBULATORY_CARE_PROVIDER_SITE_OTHER): Admitting: Ophthalmology

## 2023-10-12 DIAGNOSIS — H2513 Age-related nuclear cataract, bilateral: Secondary | ICD-10-CM | POA: Diagnosis not present

## 2023-10-12 DIAGNOSIS — H43812 Vitreous degeneration, left eye: Secondary | ICD-10-CM

## 2024-03-03 ENCOUNTER — Other Ambulatory Visit: Payer: Self-pay | Admitting: Obstetrics and Gynecology

## 2024-03-03 DIAGNOSIS — R928 Other abnormal and inconclusive findings on diagnostic imaging of breast: Secondary | ICD-10-CM

## 2024-03-08 ENCOUNTER — Inpatient Hospital Stay
Admission: RE | Admit: 2024-03-08 | Discharge: 2024-03-08 | Attending: Obstetrics and Gynecology | Admitting: Obstetrics and Gynecology

## 2024-03-08 ENCOUNTER — Other Ambulatory Visit

## 2024-03-08 DIAGNOSIS — R928 Other abnormal and inconclusive findings on diagnostic imaging of breast: Secondary | ICD-10-CM

## 2024-03-11 ENCOUNTER — Other Ambulatory Visit (HOSPITAL_COMMUNITY): Payer: Self-pay | Admitting: Gastroenterology

## 2024-03-11 DIAGNOSIS — R1011 Right upper quadrant pain: Secondary | ICD-10-CM

## 2024-03-13 ENCOUNTER — Ambulatory Visit (HOSPITAL_COMMUNITY)
Admission: RE | Admit: 2024-03-13 | Discharge: 2024-03-13 | Disposition: A | Discharge status: Home / Self Care | Source: Ambulatory Visit | Attending: Gastroenterology | Admitting: Gastroenterology

## 2024-03-13 DIAGNOSIS — R1011 Right upper quadrant pain: Secondary | ICD-10-CM | POA: Diagnosis present

## 2024-03-18 ENCOUNTER — Encounter (HOSPITAL_COMMUNITY): Admission: RE | Admit: 2024-03-18 | Discharge: 2024-03-18 | Attending: Gastroenterology | Admitting: Gastroenterology

## 2024-03-18 DIAGNOSIS — R1011 Right upper quadrant pain: Secondary | ICD-10-CM | POA: Diagnosis present

## 2024-03-18 MED ORDER — STERILE WATER FOR INJECTION IJ SOLN
INTRAMUSCULAR | Status: AC
  Filled 2024-03-18: qty 10

## 2024-03-18 MED ORDER — SINCALIDE 5 MCG IJ SOLR
INTRAMUSCULAR | Status: AC
  Filled 2024-03-18: qty 5

## 2024-03-18 MED ORDER — SINCALIDE 5 MCG IJ SOLR
0.0200 ug/kg | Freq: Once | INTRAMUSCULAR | Status: AC
  Administered 2024-03-18: 11:00:00 1.05 ug via INTRAVENOUS

## 2024-03-18 MED ORDER — TECHNETIUM TC 99M MEBROFENIN IV KIT
5.2000 | PACK | Freq: Once | INTRAVENOUS | Status: AC | PRN
  Administered 2024-03-18: 10:00:00 5.2 via INTRAVENOUS

## 2024-04-30 ENCOUNTER — Encounter: Payer: Self-pay | Admitting: Allergy & Immunology

## 2024-04-30 ENCOUNTER — Other Ambulatory Visit: Payer: Self-pay

## 2024-04-30 ENCOUNTER — Ambulatory Visit: Admitting: Allergy & Immunology

## 2024-04-30 VITALS — BP 98/64 | HR 80 | Temp 97.9°F | Ht 64.0 in | Wt 127.8 lb

## 2024-04-30 DIAGNOSIS — L239 Allergic contact dermatitis, unspecified cause: Secondary | ICD-10-CM

## 2024-04-30 DIAGNOSIS — R1084 Generalized abdominal pain: Secondary | ICD-10-CM

## 2024-04-30 DIAGNOSIS — J310 Chronic rhinitis: Secondary | ICD-10-CM | POA: Diagnosis not present

## 2024-04-30 NOTE — Addendum Note (Signed)
 Addended by: IVA MARTY SALTNESS on: 04/30/2024 02:28 PM   Modules accepted: Orders

## 2024-04-30 NOTE — Progress Notes (Signed)
 "  NEW PATIENT  Date of Service/Encounter:  04/30/24  Consult requested by: Brandi Norleen PEDLAR, MD   Assessment:   Generalized abdominal pain - thought to be related to food allergies (testing selected foods at the next visit)  Colonoscopy coming up this year  Chronic rhinitis - planning for skin testing at the next visit  Allergic contact dermatitis - planning for patch testing  Plan/Recommendations:   1. Generalized abdominal pain - We will definitely do testing to the most common foods (ruling out more than 95% of all food allergies). - Circle other foods that might be causing your symptoms.   2. Chronic rhinitis - Because of insurance stipulations, we cannot do skin testing on the same day as your first visit. - We are all working to fight this, but for now we need to do two separate visits.  - We will know more after we do testing at the next visit.  - The skin testing visit can be squeezed in at your convenience.  - Then we can make a more full plan to address all of your symptoms. - Be sure to stop your antihistamines for 3 days before this appointment  3. Contact dermatitis to cosmetics - Your symptoms are consistent with allergic contact dermatitis, which is a delayed type hypersensitivity reaction.  - This is a skin reaction that occurs when you touch or come in close contact with substances to which you are allergic.  - Your skin can become itchy, cracked, red, sore and may even bleed.  - The substances that cause this reaction are referred to as allergens and can be an ingredient in your shampoo, soap, makeup, aftershave, jewelry, medication or your clothing.  - You may also have a reaction to an allergen in your workplace because allergens are common in cleaning supplies, paper and ink, disinfectants, surveyor, quantity and rubber products.  - Triggers are diagnosed using patch testing, which is different than the traditional allergy testing. - This type of testing  takes days to react instead of minutes. - The chemicals are placed on a the back on a Monday and then you come in on a Wednesday and a Friday for readings. - During this time, you can take antihistamines like Zyrtec, Allegra, and Claritin. - You cannot shower for 48 hours AFTER the testing is placed, so keep this in mind and plan accordingly.  4. Return in about 2 weeks (around 05/14/2024) for SELECTED FOOD TESTING. You can have the follow up appointment with Dr. Iva or a Nurse Practicioner (our Nurse Practitioners are excellent and always have Physician oversight!).   This note in its entirety was forwarded to the Provider who requested this consultation.  Subjective:   Brandi Davis is a 64 y.o. female presenting today for evaluation of  Chief Complaint  Patient presents with   Food Intolerance   Abdominal Pain   Reccurrent sinus infection   Establish Care    Brandi Davis has a history of the following: Patient Active Problem List   Diagnosis Date Noted   Soft tissue mass, left shoulder 01/01/2013    History obtained from: chart review and patient.  Discussed the use of AI scribe software for clinical note transcription with the patient and/or guardian, who gave verbal consent to proceed.  Brandi Davis was referred by Brandi Norleen PEDLAR, MD.     [name] is a 64 y.o. female presenting for an evaluation of possible food allergies.  She has been followed by  Brandi Davis for decades. Brandi Davis felt that she was having some kind of food sensitivity reaction. She had labs and HIDA scan and ultrasound and it was all normal. She is scheduled for a colonoscopy this year.   She has had hives with penicillins, Fosamax, latex, Macrobid, and multiple antibiotics.   She has a history of a parasite infection in the late 1990s after a trip to [place] . She became very sick two weeks after the trip. Since then, she has experienced ongoing stomach sensitivity and pain, particularly after  consuming certain foods.  She has undergone extensive workup for her symptoms, including labs, an ultrasound, and a HIDA scan, all of which were normal. Despite these results, she continues to experience gastrointestinal discomfort, including diarrhea and cramping, particularly after eating certain foods.  She has a history of multiple medication allergies, including penicillins, Macrobid, and Fosamax, which have caused hives and severe gastrointestinal reactions. She also reports sensitivity to opioids, which cause nausea, vomiting, diarrhea, and other systemic symptoms.  She avoids peanuts and tree nuts due to stomach upset, and has recently stopped eating nuts altogether except for pecans in moderation. She also reports sensitivity to raw apples, broccoli, sweet potatoes, and pumpkin, which cause immediate gastrointestinal symptoms.  She experiences sneezing at home, particularly in her bedroom. She has no pets currently, but used to have a cat. She denies asthma and shortness of breath with reactions. She has no history of eczema. She reports sensitivity to cosmetics, particularly around her eyes and cheeks.  She has a family history of similar sensitivities, as her mother was also sensitive to various substances. She is wondering if she can do some testing to reflect any cosmetics that she might be allergic to.     Otherwise, there is no history of other atopic diseases, including drug allergies, stinging insect allergies, or contact dermatitis. There is no significant infectious history. Vaccinations are up to date.    Past Medical History: Patient Active Problem List   Diagnosis Date Noted   Soft tissue mass, left shoulder 01/01/2013    Medication List:  Allergies as of 04/30/2024       Reactions   Penicillins Shortness Of Breath, Diarrhea   Bacitracin-polymyxin B    Fosamax [alendronate] Other (See Comments)   Vomiting, diarrhea, stomach pain   Latex    Macrobid [nitrofurantoin]  Hives   Neosporin + Pain Relief Max St  [neomy-bacit-polymyx-pramoxine]    Oxycodone Hives        Medication List        Accurate as of April 30, 2024  2:22 PM. If you have any questions, ask your nurse or doctor.          CALCIUM PO Take by mouth.   cycloSPORINE 0.05 % ophthalmic emulsion Commonly known as: RESTASIS 1 drop 2 (two) times daily.   fish oil-omega-3 fatty acids 1000 MG capsule Take 2 g by mouth daily.   multivitamin tablet Take 1 tablet by mouth daily.   pantoprazole 40 MG tablet Commonly known as: PROTONIX Take 40 mg by mouth daily.        Birth History: non-contributory  Developmental History: non-contributory  Past Surgical History: Past Surgical History:  Procedure Laterality Date   BREAST BIOPSY     LAPAROSCOPY     x3     Family History: Family History  Problem Relation Age of Onset   Asthma Mother    Other Father        bladder cancer  Asthma Maternal Grandfather      Social History: Lindaann lives at home in a house.  There is hardwood with rugs in the main living areas and carpeting in the bedroom.  They have gas heating and central cooling.  There are no animals inside or outside of the home.  There are dust mite covers on the bed, but not the pillows.  There is no tobacco exposure.  She is currently retired.  There is no fume, chemical, or dust exposure.  There is no HEPA filter in the home.  She does not live near an interstate or industrial area.   Review of systems otherwise negative other than that mentioned in the HPI.    Objective:   Blood pressure 98/64, pulse 80, temperature 97.9 F (36.6 C), temperature source Temporal, height 5' 4 (1.626 m), weight 127 lb 12.8 oz (58 kg), SpO2 95%. Body mass index is 21.94 kg/m.     Physical Exam Vitals reviewed.  Constitutional:      Appearance: She is well-developed.  HENT:     Head: Normocephalic and atraumatic.     Right Ear: Tympanic membrane, ear canal and  external ear normal. No drainage, swelling or tenderness. Tympanic membrane is not injected, scarred, erythematous, retracted or bulging.     Left Ear: Tympanic membrane, ear canal and external ear normal. No drainage, swelling or tenderness. Tympanic membrane is not injected, scarred, erythematous, retracted or bulging.     Nose: Mucosal edema and rhinorrhea present. No nasal deformity or septal deviation.     Right Turbinates: Enlarged, swollen and pale.     Left Turbinates: Enlarged, swollen and pale.     Right Sinus: No maxillary sinus tenderness or frontal sinus tenderness.     Left Sinus: No maxillary sinus tenderness or frontal sinus tenderness.     Mouth/Throat:     Mouth: Mucous membranes are not pale and not dry.     Pharynx: Uvula midline.  Eyes:     General: Allergic shiner present.        Right eye: No discharge.        Left eye: No discharge.     Conjunctiva/sclera: Conjunctivae normal.     Right eye: Right conjunctiva is not injected. No chemosis.    Left eye: Left conjunctiva is not injected. No chemosis.    Pupils: Pupils are equal, round, and reactive to light.  Cardiovascular:     Rate and Rhythm: Normal rate and regular rhythm.     Heart sounds: Normal heart sounds.  Pulmonary:     Effort: Pulmonary effort is normal. No tachypnea, accessory muscle usage or respiratory distress.     Breath sounds: Normal breath sounds. No wheezing, rhonchi or rales.  Chest:     Chest wall: No tenderness.  Abdominal:     Tenderness: There is no abdominal tenderness. There is no guarding or rebound.  Lymphadenopathy:     Head:     Right side of head: No submandibular, tonsillar or occipital adenopathy.     Left side of head: No submandibular, tonsillar or occipital adenopathy.     Cervical: No cervical adenopathy.  Skin:    Coloration: Skin is not pale.     Findings: No abrasion, erythema, petechiae or rash. Rash is not papular, urticarial or vesicular.     Comments: Very  pleasant. Cooperative with the exam.  Neurological:     Mental Status: She is alert.  Psychiatric:        Behavior: Behavior  is cooperative.      Diagnostic studies: deferred due to insurance stipulations that require a separate visit for testing         Marty Shaggy, MD [facility]       "

## 2024-04-30 NOTE — Patient Instructions (Addendum)
 1. Generalized abdominal pain - We will definitely do testing to the most common foods (ruling out more than 95% of all food allergies). - Circle other foods that might be causing your symptoms.   2. Chronic rhinitis - Because of insurance stipulations, we cannot do skin testing on the same day as your first visit. - We are all working to fight this, but for now we need to do two separate visits.  - We will know more after we do testing at the next visit.  - The skin testing visit can be squeezed in at your convenience.  - Then we can make a more full plan to address all of your symptoms. - Be sure to stop your antihistamines for 3 days before this appointment  3. Contact dermatitis to cosmetics - Your symptoms are consistent with allergic contact dermatitis, which is a delayed type hypersensitivity reaction.  - This is a skin reaction that occurs when you touch or come in close contact with substances to which you are allergic.  - Your skin can become itchy, cracked, red, sore and may even bleed.  - The substances that cause this reaction are referred to as allergens and can be an ingredient in your shampoo, soap, makeup, aftershave, jewelry, medication or your clothing.  - You may also have a reaction to an allergen in your workplace because allergens are common in cleaning supplies, paper and ink, disinfectants, surveyor, quantity and rubber products.  - Triggers are diagnosed using patch testing, which is different than the traditional allergy testing. - This type of testing takes days to react instead of minutes. - The chemicals are placed on a the back on a Monday and then you come in on a Wednesday and a Friday for readings. - During this time, you can take antihistamines like Zyrtec, Allegra, and Claritin. - You cannot shower for 48 hours AFTER the testing is placed, so keep this in mind and plan accordingly. - Our NAC-80 series includes the following chemicals:      Learn  more about patch testing by scanning the code below:     4. Return in about 2 weeks (around 05/14/2024) for SELECTED FOOD TESTING. You can have the follow up appointment with Dr. Iva or a Nurse Practicioner (our Nurse Practitioners are excellent and always have Physician oversight!).    Please inform us  of any Emergency Department visits, hospitalizations, or changes in symptoms. Call us  before going to the ED for breathing or allergy symptoms since we might be able to fit you in for a sick visit. Feel free to contact us  anytime with any questions, problems, or concerns.  It was a pleasure to meet you today!  Websites that have reliable patient information: 1. American Academy of Asthma, Allergy, and Immunology: www.aaaai.org 2. Food Allergy Research and Education (FARE): foodallergy.org 3. Mothers of Asthmatics: http://www.asthmacommunitynetwork.org 4. American College of Allergy, Asthma, and Immunology: www.acaai.org      Like us  on [employer] and Instagram for our latest updates!      A healthy democracy works best when [employer] participate! Make sure you are registered to vote! If you have moved or changed any of your contact information, you will need to get this updated before voting! Scan the QR codes below to learn more!

## 2024-05-16 ENCOUNTER — Ambulatory Visit: Admitting: Allergy & Immunology
